# Patient Record
Sex: Female | Born: 1975 | Race: White | Hispanic: No | Marital: Married | Smoking: Never smoker
Health system: Southern US, Community
[De-identification: ages and names within clinical notes are randomized; demographics above are authoritative.]

## PROBLEM LIST (undated history)

## (undated) DIAGNOSIS — R519 Headache, unspecified: Secondary | ICD-10-CM

## (undated) DIAGNOSIS — G8929 Other chronic pain: Secondary | ICD-10-CM

## (undated) DIAGNOSIS — M2550 Pain in unspecified joint: Secondary | ICD-10-CM

## (undated) DIAGNOSIS — G43909 Migraine, unspecified, not intractable, without status migrainosus: Secondary | ICD-10-CM

## (undated) DIAGNOSIS — N809 Endometriosis, unspecified: Secondary | ICD-10-CM

## (undated) DIAGNOSIS — R51 Headache: Secondary | ICD-10-CM

## (undated) HISTORY — DX: Headache: R51

## (undated) HISTORY — DX: Headache, unspecified: R51.9

## (undated) HISTORY — DX: Endometriosis, unspecified: N80.9

## (undated) HISTORY — DX: Pain in unspecified joint: M25.50

## (undated) HISTORY — DX: Other chronic pain: G89.29

## (undated) HISTORY — PX: LAPAROSCOPIC ENDOMETRIOSIS FULGURATION: SUR769

## (undated) HISTORY — DX: Migraine, unspecified, not intractable, without status migrainosus: G43.909

---

## 2000-04-11 ENCOUNTER — Encounter: Payer: Self-pay | Admitting: Internal Medicine

## 2000-04-11 ENCOUNTER — Encounter: Admission: RE | Admit: 2000-04-11 | Discharge: 2000-04-11 | Payer: Self-pay | Admitting: Internal Medicine

## 2003-04-16 ENCOUNTER — Other Ambulatory Visit: Admission: RE | Admit: 2003-04-16 | Discharge: 2003-04-16 | Payer: Self-pay | Admitting: Obstetrics & Gynecology

## 2004-04-17 ENCOUNTER — Other Ambulatory Visit: Admission: RE | Admit: 2004-04-17 | Discharge: 2004-04-17 | Payer: Self-pay | Admitting: Obstetrics & Gynecology

## 2004-12-18 ENCOUNTER — Emergency Department (HOSPITAL_COMMUNITY): Admission: EM | Admit: 2004-12-18 | Discharge: 2004-12-18 | Payer: Self-pay | Admitting: Emergency Medicine

## 2006-01-07 ENCOUNTER — Ambulatory Visit (HOSPITAL_COMMUNITY): Admission: RE | Admit: 2006-01-07 | Discharge: 2006-01-07 | Payer: Self-pay | Admitting: Obstetrics & Gynecology

## 2006-01-21 ENCOUNTER — Ambulatory Visit (HOSPITAL_COMMUNITY): Admission: RE | Admit: 2006-01-21 | Discharge: 2006-01-21 | Payer: Self-pay | Admitting: Obstetrics & Gynecology

## 2006-03-10 ENCOUNTER — Inpatient Hospital Stay (HOSPITAL_COMMUNITY): Admission: AD | Admit: 2006-03-10 | Discharge: 2006-03-10 | Payer: Self-pay | Admitting: Obstetrics and Gynecology

## 2006-04-24 ENCOUNTER — Inpatient Hospital Stay (HOSPITAL_COMMUNITY): Admission: AD | Admit: 2006-04-24 | Discharge: 2006-04-25 | Payer: Self-pay | Admitting: Obstetrics and Gynecology

## 2010-08-18 ENCOUNTER — Inpatient Hospital Stay (HOSPITAL_COMMUNITY): Admission: AD | Admit: 2010-08-18 | Discharge: 2010-08-19 | Payer: Self-pay | Admitting: Obstetrics and Gynecology

## 2010-08-18 ENCOUNTER — Ambulatory Visit: Payer: Self-pay | Admitting: Nurse Practitioner

## 2010-08-19 ENCOUNTER — Inpatient Hospital Stay (HOSPITAL_COMMUNITY): Admission: AD | Admit: 2010-08-19 | Discharge: 2010-08-22 | Payer: Self-pay | Admitting: Obstetrics and Gynecology

## 2010-08-20 ENCOUNTER — Encounter (INDEPENDENT_AMBULATORY_CARE_PROVIDER_SITE_OTHER): Payer: Self-pay | Admitting: Obstetrics and Gynecology

## 2011-01-25 LAB — CBC
HCT: 28.6 % — ABNORMAL LOW (ref 36.0–46.0)
HCT: 39.2 % (ref 36.0–46.0)
Hemoglobin: 10.1 g/dL — ABNORMAL LOW (ref 12.0–15.0)
Hemoglobin: 13.5 g/dL (ref 12.0–15.0)
MCH: 33.3 pg (ref 26.0–34.0)
MCH: 34.2 pg — ABNORMAL HIGH (ref 26.0–34.0)
MCHC: 34.5 g/dL (ref 30.0–36.0)
MCHC: 35.3 g/dL (ref 30.0–36.0)
MCV: 96.3 fL (ref 78.0–100.0)
MCV: 97 fL (ref 78.0–100.0)
Platelets: 227 10*3/uL (ref 150–400)
Platelets: 281 10*3/uL (ref 150–400)
RBC: 2.95 MIL/uL — ABNORMAL LOW (ref 3.87–5.11)
RBC: 4.07 MIL/uL (ref 3.87–5.11)
RDW: 13.1 % (ref 11.5–15.5)
RDW: 13.2 % (ref 11.5–15.5)
WBC: 19.1 10*3/uL — ABNORMAL HIGH (ref 4.0–10.5)
WBC: 26.2 10*3/uL — ABNORMAL HIGH (ref 4.0–10.5)

## 2011-01-25 LAB — RPR: RPR Ser Ql: NONREACTIVE

## 2011-01-25 LAB — ABO/RH: ABO/RH(D): O POS

## 2011-03-30 NOTE — Op Note (Signed)
Shawna Chang, Shawna Chang             ACCOUNT NO.:  [id number]   MEDICAL RECORD NO.:  [id number]          PATIENT TYPE:  AMB   LOCATION:  SDC                           FACILITY:  [facility]   PHYSICIAN:  Genia Del, M.D.DATE OF BIRTH:  [birth year]   DATE OF PROCEDURE:  01/21/2006  DATE OF DISCHARGE:                                 OPERATIVE REPORT   PREOPERATIVE DIAGNOSIS:  Infertility with left proximal tubal obstruction.   POSTOPERATIVE DIAGNOSIS:  Infertility with left proximal tubal obstruction  and mild pelvic endometriosis.   OPERATION/PROCEDURE:  Diagnostic laparoscopy with cauterization of  endometriosis and chromopertubation with methylene blue.   SURGEON:  Genia Del, M.D.   ANESTHESIOLOGIST:  Octaviano Glow. Pamalee Leyden, M.D.   DESCRIPTION OF PROCEDURE:  Under general anesthesia with endotracheal  intubation, the patient placed in the lithotomy position for operative  laparoscopy. She was prepped with Betadine on the abdominal, suprapubic,  vulvar, and vaginal areas and draped as usual.  The bladder catheter was  inserted.  Vaginally the uterus is anteverted, mobile, no adnexal mass.  The  cervix is long and closed.  No bleeding.  We inserted a speculum.  The  anterior lip of the cervix grasped with a tenaculum and the uterus is  cannulated.  We then removed the speculum.  Abdominally we infiltrated the  infraumbilical area with Marcaine 0.25% plain, 6 mL.  We made a 10 mm  incision with the scalpel, opened under direct vision the aponeurosis with  Mayo scissors and bluntly we opened the parietal peritoneum.  We then put a  pursestring stitch of Vicryl 0 at the aponeurosis.  The Hasson is inserted  and the laparoscope is then inserted at that level.  Pneumoperitoneum with  CO2 is created.  We then inspect the abdominopelvic cavity.  The liver and  gallbladder are normal to inspection.  The appendix is normal to inspection.  No pathology is seen in the abdomen.  In the pelvis  we note an anteverted  uterus, normal volume and appearance.  The right tube is normal in  appearance with good fimbria and the right ovary is completely normal.  The  left tube shows some fibrosis at the proximal aspect of it.  The fimbria are  normal in appearance.  Two cysts of Morganii are present distally.  The left  ovary presents a superficial of endometriosis less than 1 cm.  On the  posterior cul-de-sac, towards the left uterosacral ligament, superficial  lesions of endometriosis are present.  On the anterior aspect of the uterus  close to the bladder flap, superficial lesions of the endometriosis are  present and a little bit higher with the lateral right anterior aspect of  the uterus, superficial lesions of endometriosis are present as well.  Pictures are taken of all those lesions and of the pelvic anatomy.  We then  used the bipolar to cauterize all the lesions previously mentioned.  We  raised the peritoneum to remain safe close to the left ureter and close to  the bladder.  We then performed the chromopertubation with methylene blue.  Methylene blue passes very easily  on the right side and a proximal blockage  is present on the left side.  We then removed all instruments, evacuated the  CO2. We attached the Vicryl 0 at the aponeurosis. We then make a  subcuticular stitch of Vicryl 4-0 and put some Dermabond at that level.   To note, we had put a 5 mm trocar under direct vision on the left iliac area  to use the bipolar at that level and at the end of the surgery, the  instrument and trocar were removed under direct vision and Dermabond was  used to close the skin.  The estimated blood loss was minimal.  No  complications occurred and the patient was brought to the recovery room in  good status.      Genia Del, M.D.  Electronically Signed     ML/MEDQ  D:  01/21/2006  T:  01/22/2006  Job:  [id number]

## 2011-05-21 ENCOUNTER — Other Ambulatory Visit (HOSPITAL_COMMUNITY): Payer: Self-pay | Admitting: Rheumatology

## 2011-05-21 ENCOUNTER — Ambulatory Visit (HOSPITAL_COMMUNITY)
Admission: RE | Admit: 2011-05-21 | Discharge: 2011-05-21 | Disposition: A | Discharge status: Home / Self Care | Payer: 59 | Source: Ambulatory Visit | Attending: Rheumatology | Admitting: Rheumatology

## 2011-05-21 DIAGNOSIS — D869 Sarcoidosis, unspecified: Secondary | ICD-10-CM

## 2013-12-22 ENCOUNTER — Other Ambulatory Visit: Payer: Self-pay | Admitting: Family Medicine

## 2013-12-22 ENCOUNTER — Ambulatory Visit
Admission: RE | Admit: 2013-12-22 | Discharge: 2013-12-22 | Disposition: A | Discharge status: Home / Self Care | Payer: BC Managed Care – PPO | Source: Ambulatory Visit | Attending: Family Medicine | Admitting: Family Medicine

## 2013-12-22 DIAGNOSIS — R059 Cough, unspecified: Secondary | ICD-10-CM

## 2013-12-22 DIAGNOSIS — R05 Cough: Secondary | ICD-10-CM

## 2013-12-22 DIAGNOSIS — R0789 Other chest pain: Secondary | ICD-10-CM

## 2015-02-24 ENCOUNTER — Other Ambulatory Visit: Payer: Self-pay | Admitting: Obstetrics and Gynecology

## 2015-02-24 DIAGNOSIS — E041 Nontoxic single thyroid nodule: Secondary | ICD-10-CM

## 2015-02-28 ENCOUNTER — Ambulatory Visit
Admission: RE | Admit: 2015-02-28 | Discharge: 2015-02-28 | Disposition: A | Discharge status: Home / Self Care | Payer: BLUE CROSS/BLUE SHIELD | Source: Ambulatory Visit | Attending: Obstetrics and Gynecology | Admitting: Obstetrics and Gynecology

## 2015-02-28 DIAGNOSIS — E041 Nontoxic single thyroid nodule: Secondary | ICD-10-CM

## 2015-03-08 ENCOUNTER — Other Ambulatory Visit: Payer: Self-pay | Admitting: Obstetrics and Gynecology

## 2015-03-08 DIAGNOSIS — E041 Nontoxic single thyroid nodule: Secondary | ICD-10-CM

## 2015-03-16 ENCOUNTER — Other Ambulatory Visit: Payer: BLUE CROSS/BLUE SHIELD

## 2015-03-17 ENCOUNTER — Ambulatory Visit
Admission: RE | Admit: 2015-03-17 | Discharge: 2015-03-17 | Disposition: A | Discharge status: Home / Self Care | Payer: BLUE CROSS/BLUE SHIELD | Source: Ambulatory Visit | Attending: Obstetrics and Gynecology | Admitting: Obstetrics and Gynecology

## 2015-03-17 ENCOUNTER — Other Ambulatory Visit (HOSPITAL_COMMUNITY)
Admission: RE | Admit: 2015-03-17 | Discharge: 2015-03-17 | Disposition: A | Discharge status: Home / Self Care | Payer: BLUE CROSS/BLUE SHIELD | Source: Ambulatory Visit | Attending: Interventional Radiology | Admitting: Interventional Radiology

## 2015-03-17 DIAGNOSIS — E041 Nontoxic single thyroid nodule: Secondary | ICD-10-CM

## 2015-05-12 ENCOUNTER — Ambulatory Visit (INDEPENDENT_AMBULATORY_CARE_PROVIDER_SITE_OTHER): Payer: BLUE CROSS/BLUE SHIELD | Admitting: Endocrinology

## 2015-05-12 ENCOUNTER — Encounter: Payer: Self-pay | Admitting: Endocrinology

## 2015-05-12 VITALS — BP 114/64 | HR 76 | Temp 97.8°F | Ht 65.5 in | Wt 154.0 lb

## 2015-05-12 DIAGNOSIS — E042 Nontoxic multinodular goiter: Secondary | ICD-10-CM

## 2015-05-12 NOTE — Progress Notes (Signed)
   Subjective:    Patient ID: Shawna Chang, female    DOB: 05/04/1976, 39 y.o.   MRN: [id number]  HPI Pt was noted to have a nodule at the thyroid in in early 2016.  She has never been known to have a thyroid problem before.  she has no h/o XRT or surgery to the neck.  She does not notice the goiter.  she has slight fatigue and assoc weight gain.   No past medical history on file.  No past surgical history on file.  History   Social History  . Marital Status: Married    Spouse Name: N/A  . Number of Children: N/A  . Years of Education: N/A   Occupational History  . Not on file.   Social History Main Topics  . Smoking status: Never Smoker   . Smokeless tobacco: Not on file  . Alcohol Use: No  . Drug Use: Not on file  . Sexual Activity: Not on file   Other Topics Concern  . Not on file   Social History Narrative  . No narrative on file    No current outpatient prescriptions on file prior to visit.   No current facility-administered medications on file prior to visit.    Allergies  Allergen Reactions  . Codeine Other (See Comments)    Family History  Problem Relation Age of Onset  . Thyroid disease Mother   . Thyroid disease Father   . Thyroid disease Sister     BP 114/64 mmHg  Pulse 76  Temp(Src) 97.8 F (36.6 C) (Oral)  Ht 5' 5.5" (1.664 m)  Wt 154 lb (69.854 kg)  BMI 25.23 kg/m2  SpO2 99%  LMP 04/18/2015   Review of Systems Denies dysphagia, sob, and neck pain    Objective:   Physical Exam VITAL SIGNS:  See vs page GENERAL: no distress eyes: no periorbital swelling, no proptosis NECK: thyroid is slightly enlarged, with a multinodular surface. NODES: no palpable lymphadenopathy at the anterior neck. SKIN: not diaphoretic NEURO: no tremor Ext: no edema   outside test results are reviewed: TSH=normal Cytology of thyroid nodule bx: benign nodule.   Radiol: i reviewed thyroid US report      Assessment & Plan:  Thyroid adenoma, new  to me.  She is at risk for hyperthyroidism over time.    Patient is advised the following: Patient Instructions  No thyroid treatment is needed now. Please return in 1 year. most of the time, a "lumpy thyroid" will eventually become overactive.  this is usually a slow process, happening over the span of many years.

## 2015-05-12 NOTE — Patient Instructions (Signed)
No thyroid treatment is needed now. Please return in 1 year. most of the time, a "lumpy thyroid" will eventually become overactive.  this is usually a slow process, happening over the span of many years.

## 2015-05-15 DIAGNOSIS — E042 Nontoxic multinodular goiter: Secondary | ICD-10-CM | POA: Insufficient documentation

## 2015-11-10 ENCOUNTER — Other Ambulatory Visit: Payer: Self-pay

## 2015-11-10 DIAGNOSIS — Z1231 Encounter for screening mammogram for malignant neoplasm of breast: Secondary | ICD-10-CM

## 2015-12-06 ENCOUNTER — Ambulatory Visit
Admission: RE | Admit: 2015-12-06 | Discharge: 2015-12-06 | Disposition: A | Discharge status: Home / Self Care | Payer: BLUE CROSS/BLUE SHIELD | Source: Ambulatory Visit

## 2015-12-06 DIAGNOSIS — Z1231 Encounter for screening mammogram for malignant neoplasm of breast: Secondary | ICD-10-CM

## 2016-05-11 ENCOUNTER — Ambulatory Visit: Payer: BLUE CROSS/BLUE SHIELD | Admitting: Endocrinology

## 2016-05-17 ENCOUNTER — Ambulatory Visit (INDEPENDENT_AMBULATORY_CARE_PROVIDER_SITE_OTHER): Payer: BLUE CROSS/BLUE SHIELD | Admitting: Endocrinology

## 2016-05-17 ENCOUNTER — Encounter: Payer: Self-pay | Admitting: Endocrinology

## 2016-05-17 VITALS — BP 98/62 | HR 63 | Wt 150.2 lb

## 2016-05-17 DIAGNOSIS — E042 Nontoxic multinodular goiter: Secondary | ICD-10-CM

## 2016-05-17 NOTE — Patient Instructions (Addendum)
Let's recheck the ultrasound.  you will receive a phone call, about a day and time for an appointment. blood tests are requested for you today.   We'll let you know about the results of both tests.  Please return in 2 years.  most of the time, a "lumpy thyroid" will eventually become overactive.  this is usually a slow process, happening over the span of many years.

## 2016-05-17 NOTE — Progress Notes (Signed)
   Subjective:    Patient ID: Shawna Chang, female    DOB: 03/21/1976, 40 y.o.   MRN: [id number]  HPI  Pt returns for f/u of multinodular goiter (dx'ed 2016; bx then showed BENIGN FOLLICULAR NODULE (BETHESDA CATEGORY II); she has been euthyroid). She does not notice the goiter.   No past medical history on file.  No past surgical history on file.  Social History   Social History  . Marital Status: Married    Spouse Name: N/A  . Number of Children: N/A  . Years of Education: N/A   Occupational History  . Not on file.   Social History Main Topics  . Smoking status: Never Smoker   . Smokeless tobacco: Not on file  . Alcohol Use: No  . Drug Use: Not on file  . Sexual Activity: Not on file   Other Topics Concern  . Not on file   Social History Narrative    No current outpatient prescriptions on file prior to visit.   No current facility-administered medications on file prior to visit.    Allergies  Allergen Reactions  . Penicillins Other (See Comments)  . Codeine Other (See Comments)    Family History  Problem Relation Age of Onset  . Thyroid disease Mother   . Thyroid disease Father   . Thyroid disease Sister     BP 98/62 mmHg  Pulse 63  Wt 150 lb 3.2 oz (68.13 kg)  SpO2 98%  LMP 05/12/2016  Review of Systems Denies dysphagia and sob.    Objective:   Physical Exam VITAL SIGNS:  See vs page GENERAL: no distress NECK: thyroid is slightly enlarged, with a irregular surface.      Assessment & Plan:  Multinodular goiter: due for recheck.   Patient is advised the following: Patient Instructions  Let's recheck the ultrasound.  you will receive a phone call, about a day and time for an appointment. blood tests are requested for you today.   We'll let you know about the results of both tests.  Please return in 2 years.  most of the time, a "lumpy thyroid" will eventually become overactive.  this is usually a slow process, happening over the span of  many years.   Romero BellingELLISON, Thara Searing, MD

## 2016-05-18 ENCOUNTER — Ambulatory Visit
Admission: RE | Admit: 2016-05-18 | Discharge: 2016-05-18 | Disposition: A | Discharge status: Home / Self Care | Payer: BLUE CROSS/BLUE SHIELD | Source: Ambulatory Visit | Attending: Endocrinology | Admitting: Endocrinology

## 2016-05-18 ENCOUNTER — Telehealth: Payer: Self-pay

## 2016-05-18 DIAGNOSIS — E042 Nontoxic multinodular goiter: Secondary | ICD-10-CM | POA: Diagnosis not present

## 2016-05-18 LAB — TSH: TSH: 1.22 u[IU]/mL (ref 0.450–4.500)

## 2016-05-18 NOTE — Telephone Encounter (Signed)
Called and spoke with patient about normal lab results. No questions or concerns.

## 2016-06-22 ENCOUNTER — Ambulatory Visit (INDEPENDENT_AMBULATORY_CARE_PROVIDER_SITE_OTHER): Payer: BLUE CROSS/BLUE SHIELD | Admitting: Podiatry

## 2016-06-22 ENCOUNTER — Ambulatory Visit (INDEPENDENT_AMBULATORY_CARE_PROVIDER_SITE_OTHER): Payer: BLUE CROSS/BLUE SHIELD

## 2016-06-22 DIAGNOSIS — M79672 Pain in left foot: Secondary | ICD-10-CM

## 2016-06-22 DIAGNOSIS — M79671 Pain in right foot: Secondary | ICD-10-CM | POA: Diagnosis not present

## 2016-06-22 DIAGNOSIS — M722 Plantar fascial fibromatosis: Secondary | ICD-10-CM | POA: Diagnosis not present

## 2016-06-22 MED ORDER — DICLOFENAC SODIUM 75 MG PO TBEC: 75.0000 mg | DELAYED_RELEASE_TABLET | Freq: Two times a day (BID) | ORAL | 2 refills | Status: DC

## 2016-06-22 MED ORDER — TRIAMCINOLONE ACETONIDE 10 MG/ML IJ SUSP
10.0000 mg | Freq: Once | INTRAMUSCULAR | Status: AC
  Administered 2016-06-22: 10 mg

## 2016-06-22 NOTE — Patient Instructions (Signed)

## 2016-06-22 NOTE — Progress Notes (Signed)
   Subjective:    Patient ID: Shawna Chang, female    DOB: 07/05/1976, 40 y.o.   MRN: [id number]  HPI  I have been having pain in my arch and forefoot fore several months.     Review of Systems  All other systems reviewed and are negative.      Objective:   Physical Exam        Assessment & Plan:

## 2016-06-26 NOTE — Progress Notes (Signed)
Subjective:     Patient ID: Shawna Chang, female   DOB: 06/27/1976, 40 y.o.   MRN: [id number]  HPI patient states that she's having a lot of pain in her arch right over left with inflammation fluid buildup in both of them are bothering her at times   Review of Systems  All other systems reviewed and are negative.      Objective:   Physical Exam  Constitutional: She is oriented to person, place, and time.  Cardiovascular: Intact distal pulses.   Musculoskeletal: Normal range of motion.  Neurological: She is oriented to person, place, and time.  Skin: Skin is warm.  Nursing note and vitals reviewed.  neurovascular status found to be intact with muscle strength adequate inflammation noted mid arch area bilateral with patient found to have good digital perfusion and well oriented 3. 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 Today mid arch injection was accomplished 3 mg Kenalog 5 mg Xylocaine and fascial strapping and braces applied with instructions on usage along with physical therapy and shoe gear modifications. Reappoint to recheck and also placed on diclofenac 75 mg twice a day  X-rays indicate moderate depression of the arch with no indications of stress fracture arthritis

## 2016-07-06 ENCOUNTER — Ambulatory Visit: Payer: BLUE CROSS/BLUE SHIELD | Admitting: Podiatry

## 2016-07-13 ENCOUNTER — Ambulatory Visit: Payer: BLUE CROSS/BLUE SHIELD | Admitting: Podiatry

## 2016-08-23 DIAGNOSIS — N939 Abnormal uterine and vaginal bleeding, unspecified: Secondary | ICD-10-CM | POA: Diagnosis not present

## 2016-09-19 DIAGNOSIS — N938 Other specified abnormal uterine and vaginal bleeding: Secondary | ICD-10-CM | POA: Diagnosis not present

## 2016-10-16 DIAGNOSIS — J0110 Acute frontal sinusitis, unspecified: Secondary | ICD-10-CM | POA: Diagnosis not present

## 2016-10-31 ENCOUNTER — Other Ambulatory Visit: Payer: Self-pay | Admitting: Obstetrics and Gynecology

## 2016-10-31 DIAGNOSIS — Z1231 Encounter for screening mammogram for malignant neoplasm of breast: Secondary | ICD-10-CM

## 2016-12-06 ENCOUNTER — Ambulatory Visit
Admission: RE | Admit: 2016-12-06 | Discharge: 2016-12-06 | Disposition: A | Discharge status: Home / Self Care | Payer: BLUE CROSS/BLUE SHIELD | Source: Ambulatory Visit | Attending: Obstetrics and Gynecology | Admitting: Obstetrics and Gynecology

## 2016-12-06 DIAGNOSIS — Z1231 Encounter for screening mammogram for malignant neoplasm of breast: Secondary | ICD-10-CM | POA: Diagnosis not present

## 2016-12-10 ENCOUNTER — Other Ambulatory Visit: Payer: Self-pay | Admitting: Obstetrics and Gynecology

## 2016-12-10 DIAGNOSIS — R928 Other abnormal and inconclusive findings on diagnostic imaging of breast: Secondary | ICD-10-CM

## 2016-12-17 ENCOUNTER — Ambulatory Visit
Admission: RE | Admit: 2016-12-17 | Discharge: 2016-12-17 | Disposition: A | Discharge status: Home / Self Care | Payer: BLUE CROSS/BLUE SHIELD | Source: Ambulatory Visit | Attending: Obstetrics and Gynecology | Admitting: Obstetrics and Gynecology

## 2016-12-17 DIAGNOSIS — R922 Inconclusive mammogram: Secondary | ICD-10-CM | POA: Diagnosis not present

## 2016-12-17 DIAGNOSIS — R928 Other abnormal and inconclusive findings on diagnostic imaging of breast: Secondary | ICD-10-CM

## 2016-12-17 DIAGNOSIS — N6489 Other specified disorders of breast: Secondary | ICD-10-CM | POA: Diagnosis not present

## 2017-01-15 DIAGNOSIS — L718 Other rosacea: Secondary | ICD-10-CM | POA: Diagnosis not present

## 2017-01-15 DIAGNOSIS — D2239 Melanocytic nevi of other parts of face: Secondary | ICD-10-CM | POA: Diagnosis not present

## 2017-01-15 DIAGNOSIS — D2262 Melanocytic nevi of left upper limb, including shoulder: Secondary | ICD-10-CM | POA: Diagnosis not present

## 2017-01-15 DIAGNOSIS — D1801 Hemangioma of skin and subcutaneous tissue: Secondary | ICD-10-CM | POA: Diagnosis not present

## 2017-03-12 DIAGNOSIS — Z6824 Body mass index (BMI) 24.0-24.9, adult: Secondary | ICD-10-CM | POA: Diagnosis not present

## 2017-03-12 DIAGNOSIS — Z01419 Encounter for gynecological examination (general) (routine) without abnormal findings: Secondary | ICD-10-CM | POA: Diagnosis not present

## 2017-03-27 DIAGNOSIS — T7840XA Allergy, unspecified, initial encounter: Secondary | ICD-10-CM | POA: Diagnosis not present

## 2017-04-22 DIAGNOSIS — R220 Localized swelling, mass and lump, head: Secondary | ICD-10-CM | POA: Diagnosis not present

## 2017-04-22 DIAGNOSIS — T7840XA Allergy, unspecified, initial encounter: Secondary | ICD-10-CM | POA: Diagnosis not present

## 2017-05-27 DIAGNOSIS — H1789 Other corneal scars and opacities: Secondary | ICD-10-CM | POA: Diagnosis not present

## 2017-07-01 DIAGNOSIS — J019 Acute sinusitis, unspecified: Secondary | ICD-10-CM | POA: Diagnosis not present

## 2017-07-08 DIAGNOSIS — R002 Palpitations: Secondary | ICD-10-CM | POA: Diagnosis not present

## 2017-07-08 DIAGNOSIS — J329 Chronic sinusitis, unspecified: Secondary | ICD-10-CM | POA: Diagnosis not present

## 2017-07-08 DIAGNOSIS — Z0000 Encounter for general adult medical examination without abnormal findings: Secondary | ICD-10-CM | POA: Diagnosis not present

## 2017-07-08 DIAGNOSIS — Z13220 Encounter for screening for lipoid disorders: Secondary | ICD-10-CM | POA: Diagnosis not present

## 2017-07-08 DIAGNOSIS — M797 Fibromyalgia: Secondary | ICD-10-CM | POA: Diagnosis not present

## 2017-07-08 DIAGNOSIS — Z131 Encounter for screening for diabetes mellitus: Secondary | ICD-10-CM | POA: Diagnosis not present

## 2017-07-08 DIAGNOSIS — R51 Headache: Secondary | ICD-10-CM | POA: Diagnosis not present

## 2017-07-09 ENCOUNTER — Other Ambulatory Visit: Payer: Self-pay | Admitting: Family Medicine

## 2017-07-09 DIAGNOSIS — J329 Chronic sinusitis, unspecified: Secondary | ICD-10-CM

## 2017-07-11 ENCOUNTER — Encounter: Payer: Self-pay | Admitting: Neurology

## 2017-07-11 ENCOUNTER — Ambulatory Visit (INDEPENDENT_AMBULATORY_CARE_PROVIDER_SITE_OTHER): Payer: BLUE CROSS/BLUE SHIELD | Admitting: Neurology

## 2017-07-11 DIAGNOSIS — G43709 Chronic migraine without aura, not intractable, without status migrainosus: Secondary | ICD-10-CM | POA: Diagnosis not present

## 2017-07-11 DIAGNOSIS — IMO0002 Reserved for concepts with insufficient information to code with codable children: Secondary | ICD-10-CM

## 2017-07-11 DIAGNOSIS — G43909 Migraine, unspecified, not intractable, without status migrainosus: Secondary | ICD-10-CM | POA: Insufficient documentation

## 2017-07-11 MED ORDER — SUMATRIPTAN SUCCINATE 50 MG PO TABS: 50.0000 mg | ORAL_TABLET | ORAL | 6 refills | Status: DC | PRN

## 2017-07-11 NOTE — Progress Notes (Signed)
PATIENT: Shawna Chang DOB: 11/30/1975  Chief Complaint  Patient presents with  . Headache    Reports having one migraine per month during her menstrual cycle.  NSAIDS are not beneficial.  She tends to have nausea and vomiting with these headaches.  She has a pending CT head due to chronic sinusitis.   Marland Kitchen. PCP    Maurice SmallGriffin, Elaine, MD  . OB-GYN    Olivia Mackieaavon, Richard, MD - referring MD     HISTORICAL  Shawna Chang is a 41 years old right-handed female, seen in refer by her primary care doctor Maurice SmallGriffin, Elaine and Dr. Olivia Mackieaavon, Richard, for evaluation of chronic migraine headaches, initial evaluation was July 11 2017.  I reviewed and summarized the referring note, she has left ear fluid, otherwise healthy, she began to notice almost monthly headaches since February 2018, it started before her menstruation cycle, sometimes preceded by seeing sparkling in her visual field, with lateralized moderate pounding headache, light noise sensitivity, nauseous, lasting for couple hours, improved by lying in dark quiet room,  She has tried over-the-counter Tylenol, Excedrin Migraine with limited help, Aleve plus caffeine was helpful,  REVIEW OF SYSTEMS: Full 14 system review of systems performed and notable only for Weight gain, fatigue, chest pain, palpitation, murmur, increased thirst, joint pain, swelling, allergy, runny nose, headaches  ALLERGIES: Allergies  Allergen Reactions  . Amoxicillin     GI issues  . Codeine Other (See Comments)    Unsure - childhood allergy  . Lidocaine Hives    HOME MEDICATIONS: Current Outpatient Prescriptions  Medication Sig Dispense Refill  . sulfamethoxazole-trimethoprim (BACTRIM DS,SEPTRA DS) 800-160 MG tablet      No current facility-administered medications for this visit.     PAST MEDICAL HISTORY: Past Medical History:  Diagnosis Date  . Chronic joint pain   . Endometriosis   . Migraine     PAST SURGICAL HISTORY: Past Surgical History:    Procedure Laterality Date  . LAPAROSCOPIC ENDOMETRIOSIS FULGURATION      FAMILY HISTORY: Family History  Problem Relation Age of Onset  . Thyroid disease Mother   . Goiter Mother   . Thyroid disease Father   . Colon cancer Father   . Thyroid disease Sister   . Heart disease Paternal Grandmother   . Parkinson's disease Paternal Grandfather     SOCIAL HISTORY:  Social History   Social History  . Marital status: Married    Spouse name: N/A  . Number of children: 1  . Years of education: College   Occupational History  . Customer service    Social History Main Topics  . Smoking status: Never Smoker  . Smokeless tobacco: Current User    Types: Snuff  . Alcohol use No  . Drug use: No  . Sexual activity: Not on file   Other Topics Concern  . Not on file   Social History Narrative   Lives at home with husband and daughter.   Right-handed.   No caffeine use.     PHYSICAL EXAM   Vitals:   07/11/17 0832  BP: (!) 91/57  Pulse: 67  Weight: 154 lb 12 oz (70.2 kg)  Height: 5' 5.5" (1.664 m)    Not recorded      Body mass index is 25.36 kg/m.  PHYSICAL EXAMNIATION:  Gen: NAD, conversant, well nourised, obese, well groomed                     Cardiovascular: Regular  rate rhythm, no peripheral edema, warm, nontender. Eyes: Conjunctivae clear without exudates or hemorrhage Neck: Supple, no carotid bruits. Pulmonary: Clear to auscultation bilaterally   NEUROLOGICAL EXAM:  MENTAL STATUS: Speech:    Speech is normal; fluent and spontaneous with normal comprehension.  Cognition:     Orientation to time, place and person     Normal recent and remote memory     Normal Attention span and concentration     Normal Language, naming, repeating,spontaneous speech     Fund of knowledge   CRANIAL NERVES: CN II: Visual fields are full to confrontation. Fundoscopic exam is normal with sharp discs and no vascular changes. Pupils are round equal and briskly reactive  to light. CN III, IV, VI: extraocular movement are normal. No ptosis. CN V: Facial sensation is intact to pinprick in all 3 divisions bilaterally. Corneal responses are intact.  CN VII: Face is symmetric with normal eye closure and smile. CN VIII: Hearing is normal to rubbing fingers CN IX, X: Palate elevates symmetrically. Phonation is normal. CN XI: Head turning and shoulder shrug are intact CN XII: Tongue is midline with normal movements and no atrophy.  MOTOR: There is no pronator drift of out-stretched arms. Muscle bulk and tone are normal. Muscle strength is normal.  REFLEXES: Reflexes are 2+ and symmetric at the biceps, triceps, knees, and ankles. Plantar responses are flexor.  SENSORY: Intact to light touch, pinprick, positional sensation and vibratory sensation are intact in fingers and toes.  COORDINATION: Rapid alternating movements and fine finger movements are intact. There is no dysmetria on finger-to-nose and heel-knee-shin.    GAIT/STANCE: Posture is normal. Gait is steady with normal steps, base, arm swing, and turning. Heel and toe walking are normal. Tandem gait is normal.  Romberg is absent.   DIAGNOSTIC DATA (LABS, IMAGING, TESTING) - I reviewed patient records, labs, notes, testing and imaging myself where available.   ASSESSMENT AND PLAN  [name] is a 41 y.o. female   Chronic migraine  Imitrex 50 mg as needed  She may combine it with Aleve, caffeine  We also went over the potential trigger for her migraines,   Levert Feinstein, M.D. Ph.D.  [facility] [address], [address] [place], [place] [address] Ph: [phone] Fax: [phone]  CC: Maurice Small, MD, Olivia Mackie, MD

## 2017-07-12 ENCOUNTER — Ambulatory Visit
Admission: RE | Admit: 2017-07-12 | Discharge: 2017-07-12 | Disposition: A | Discharge status: Home / Self Care | Payer: BLUE CROSS/BLUE SHIELD | Source: Ambulatory Visit | Attending: Family Medicine | Admitting: Family Medicine

## 2017-07-12 DIAGNOSIS — J329 Chronic sinusitis, unspecified: Secondary | ICD-10-CM

## 2017-07-23 ENCOUNTER — Encounter: Payer: Self-pay | Admitting: Allergy and Immunology

## 2017-07-23 ENCOUNTER — Ambulatory Visit (INDEPENDENT_AMBULATORY_CARE_PROVIDER_SITE_OTHER): Payer: BLUE CROSS/BLUE SHIELD | Admitting: Allergy and Immunology

## 2017-07-23 VITALS — BP 108/76 | HR 68 | Resp 18 | Ht 65.5 in | Wt 156.4 lb

## 2017-07-23 DIAGNOSIS — G43909 Migraine, unspecified, not intractable, without status migrainosus: Secondary | ICD-10-CM | POA: Diagnosis not present

## 2017-07-23 DIAGNOSIS — B001 Herpesviral vesicular dermatitis: Secondary | ICD-10-CM | POA: Diagnosis not present

## 2017-07-23 DIAGNOSIS — J3089 Other allergic rhinitis: Secondary | ICD-10-CM | POA: Diagnosis not present

## 2017-07-23 DIAGNOSIS — M2550 Pain in unspecified joint: Secondary | ICD-10-CM | POA: Diagnosis not present

## 2017-07-23 MED ORDER — VALACYCLOVIR HCL 500 MG PO TABS: 500.0000 mg | ORAL_TABLET | Freq: Every day | ORAL | 6 refills | Status: DC

## 2017-07-23 NOTE — Patient Instructions (Addendum)
  1. Allergen avoidance measures?  2. Valtrex 500 mg 1 time per day  3. CMP, CBC w/diff, ANA w/reflex, HSV 1/2 IgM/IgG  4. Avoid all forms of caffeine including chocolate consumption  5. Contact clinic concerning response to treatment over the next 4 weeks  6. Can add OTC antihistamine - Claritin/Allegra/Zyrtec - one time per day  7. Further treatment?

## 2017-07-23 NOTE — Progress Notes (Signed)
Dear Dr. Valentina Lucks,  Thank you for referring Shawna Chang to the [facility] on 07/23/2017.   Below is a summation of this patient's evaluation and recommendations.  Thank you for your referral. I will keep you informed about this patient's response to treatment.   If you have any questions please do not hesitate to contact me.   Sincerely,  Jessica Priest, MD Allergy / Immunology  [facility]   ______________________________________________________________________    NEW PATIENT NOTE  Referring Provider: Maurice Small, MD Primary Provider: Maurice Small, MD Date of office visit: 07/23/2017    Subjective:   Chief Complaint:  Shawna Chang (DOB: December 14, 1975) is a 41 y.o. female who presents to the clinic on 07/23/2017 with a chief complaint of Allergic Rhinitis  .     HPI: Shawna Chang presents to this clinic in evaluation of problems that have developed over the course of the past 2 months. Apparently she has been developing recurrent episodes of red swollen painful lips that end up chapping and have required 4 courses of systemic steroids with resolution in approximately one week. There is no associated systemic or constitutional symptoms with these episodes. There is no obvious provoking factor giving rise to this issue.  However, during this timeframe she is also been having a left frontal and cheek "pressure" headache and has been treated with 2 courses of antibiotics. A CT scan of her sinuses in investigation of this issue did not identify any sinusitis. She is followed by a neurologist for migraine headaches.She has also noticed that the skin under her left eye appears to be somewhat "bumpy". She does have a history of runny nose usually clear in production without any associated sneezing nor anosmia. She does have a history of arthralgia that has been evaluated by a rheumatologist  in the past and apparently there is no autoimmune disease identified.  Past Medical History:  Diagnosis Date  . Chronic joint pain   . Endometriosis   . Migraine     Past Surgical History:  Procedure Laterality Date  . LAPAROSCOPIC ENDOMETRIOSIS FULGURATION      Allergies as of 07/23/2017      Reactions   Amoxicillin    GI issues   Codeine Other (See Comments)   Unsure - childhood allergy   Lidocaine Hives      Medication List      SUMAtriptan 50 MG tablet Commonly known as:  IMITREX Take 1 tablet (50 mg total) by mouth every 2 (two) hours as needed for migraine. May repeat in 2 hours if headache persists or recurs.       Review of systems negative except as noted in HPI / PMHx or noted below:  Review of Systems  Constitutional: Negative.   HENT: Negative.   Eyes: Negative.   Respiratory: Negative.   Cardiovascular: Negative.   Gastrointestinal: Negative.   Genitourinary: Negative.   Musculoskeletal: Negative.   Skin: Negative.   Neurological: Negative.   Endo/Heme/Allergies: Negative.   Psychiatric/Behavioral: Negative.     Family History  Problem Relation Age of Onset  . Thyroid disease Mother   . Goiter Mother   . Thyroid disease Father   . Colon cancer Father   . Thyroid disease Sister   . Heart disease Paternal Grandmother   . Parkinson's disease Paternal Grandfather     Social History   Social History  . Marital status: Married  Spouse name: N/A  . Number of children: 1  . Years of education: College   Occupational History  . Customer service    Social History Main Topics  . Smoking status: Never Smoker  . Smokeless tobacco: Never Used  . Alcohol use No  . Drug use: No  . Sexual activity: Not on file   Other Topics Concern  . Not on file   Social History Narrative   Lives at home with husband and daughter.   Right-handed.   No caffeine use.    Environmental and Social history  Lives in a house with a dry environment, a  dog located inside the household, plastic on the bed, no plastic on the pillow, no smokers located inside the household, and employment in an office setting.  Objective:   Vitals:   07/23/17 0831  BP: 108/76  Pulse: 68  Resp: 18   Height: 5' 5.5" (166.4 cm) Weight: 156 lb 6.4 oz (70.9 kg)  Physical Exam  Constitutional: She is well-developed, well-nourished, and in no distress.  HENT:  Head: Normocephalic. Head is without right periorbital erythema and without left periorbital erythema.  Right Ear: Tympanic membrane, external ear and ear canal normal.  Left Ear: Tympanic membrane, external ear and ear canal normal.  Nose: Nose normal. No mucosal edema or rhinorrhea.  Mouth/Throat: Oropharynx is clear and moist and mucous membranes are normal. No oropharyngeal exudate.  Eyes: Pupils are equal, round, and reactive to light. Conjunctivae and lids are normal.  Neck: Trachea normal. No tracheal deviation present. No thyromegaly present.  Cardiovascular: Normal rate, regular rhythm, S1 normal, S2 normal and normal heart sounds.   No murmur heard. Pulmonary/Chest: Effort normal. No stridor. No tachypnea. No respiratory distress. She has no wheezes. She has no rales. She exhibits no tenderness.  Abdominal: Soft. She exhibits no distension and no mass. There is no hepatosplenomegaly. There is no tenderness. There is no rebound and no guarding.  Musculoskeletal: She exhibits no edema or tenderness.  Lymphadenopathy:       Head (right side): No tonsillar adenopathy present.       Head (left side): No tonsillar adenopathy present.    She has no cervical adenopathy.    She has no axillary adenopathy.  Neurological: She is alert. Gait normal.  Skin: No rash noted. She is not diaphoretic. No erythema. No pallor. Nails show no clubbing.  Psychiatric: Mood and affect normal.    Diagnostics: Allergy skin tests were performed. She did not demonstrate any hypersensitivity against a screening panel  of aeroallergens or foods.  Results of a sinus CT scan obtained 30 June August 2018 identified the following:  Normally aerated paranasal sinuses.  Patent sinus drainage pathways.   Assessment and Plan:    1. Migraine syndrome   2. Herpes labialis   3. Other allergic rhinitis   4. Arthralgia, unspecified joint     1. Allergen avoidance measures?  2. Valtrex 500 mg 1 time per day  3. CMP, CBC w/diff, ANA w/reflex, HSV 1/2 IgM/IgG  4. Avoid all forms of caffeine including chocolate consumption  5. Contact clinic concerning response to treatment over the next 4 weeks  6. Can add OTC antihistamine - Claritin/Allegra/Zyrtec - one time per day  7. Further treatment?  I think it is quite possible that Tawonda has granulomatous chelitis or possible herpetic infection giving rise to her lip issue. She will use Valtrex empirically over the course of the next month and keep in contact with me  noting her response to this approach. We will look for a autoimmune disease such as Sjogren syndrome tied up with granulomatous chelitis. For her migraine headaches I did make the general recommendation that she can eliminate all forms of caffeine including chocolate consumption.  Jessica PriestEric J. Kozlow, MD Allergy / Immunology [facility]

## 2017-07-24 ENCOUNTER — Encounter: Payer: Self-pay | Admitting: Allergy and Immunology

## 2017-07-25 LAB — COMPREHENSIVE METABOLIC PANEL
ALT: 11 IU/L (ref 0–32)
AST: 17 IU/L (ref 0–40)
Albumin/Globulin Ratio: 1.5 (ref 1.2–2.2)
Albumin: 4.3 g/dL (ref 3.5–5.5)
Alkaline Phosphatase: 55 IU/L (ref 39–117)
BUN/Creatinine Ratio: 11 (ref 9–23)
BUN: 7 mg/dL (ref 6–24)
Bilirubin Total: 0.5 mg/dL (ref 0.0–1.2)
CO2: 24 mmol/L (ref 20–29)
Calcium: 9.5 mg/dL (ref 8.7–10.2)
Chloride: 101 mmol/L (ref 96–106)
Creatinine, Ser: 0.65 mg/dL (ref 0.57–1.00)
GFR calc Af Amer: 127 mL/min/{1.73_m2} (ref >59)
GFR calc non Af Amer: 110 mL/min/{1.73_m2} (ref >59)
Globulin, Total: 2.8 g/dL (ref 1.5–4.5)
Glucose: 85 mg/dL (ref 65–99)
Potassium: 4.2 mmol/L (ref 3.5–5.2)
Sodium: 138 mmol/L (ref 134–144)
Total Protein: 7.1 g/dL (ref 6.0–8.5)

## 2017-07-25 LAB — CBC WITH DIFFERENTIAL/PLATELET
Basophils Absolute: 0 10*3/uL (ref 0.0–0.2)
Basos: 1 %
EOS (ABSOLUTE): 0.1 10*3/uL (ref 0.0–0.4)
Eos: 1 %
Hematocrit: 40.2 % (ref 34.0–46.6)
Hemoglobin: 13.6 g/dL (ref 11.1–15.9)
Immature Grans (Abs): 0 10*3/uL (ref 0.0–0.1)
Immature Granulocytes: 0 %
Lymphocytes Absolute: 1.6 10*3/uL (ref 0.7–3.1)
Lymphs: 32 %
MCH: 31.1 pg (ref 26.6–33.0)
MCHC: 33.8 g/dL (ref 31.5–35.7)
MCV: 92 fL (ref 79–97)
Monocytes Absolute: 0.4 10*3/uL (ref 0.1–0.9)
Monocytes: 9 %
Neutrophils Absolute: 2.9 10*3/uL (ref 1.4–7.0)
Neutrophils: 57 %
Platelets: 251 10*3/uL (ref 150–379)
RBC: 4.37 x10E6/uL (ref 3.77–5.28)
RDW: 12.8 % (ref 12.3–15.4)
WBC: 5 10*3/uL (ref 3.4–10.8)

## 2017-07-25 LAB — ANA W/REFLEX: Anti Nuclear Antibody(ANA): NEGATIVE

## 2017-07-25 LAB — HSV(HERPES SMPLX)ABS-I+II(IGG+IGM)-BLD
HSV 1 Glycoprotein G Ab, IgG: <0.91 index (ref 0.00–0.90)
HSV 2 IgG, Type Spec: <0.91 index (ref 0.00–0.90)
HSVI/II Comb IgM: 1.52 Ratio — ABNORMAL HIGH (ref 0.00–0.90)

## 2017-08-09 ENCOUNTER — Telehealth: Payer: Self-pay | Admitting: Allergy and Immunology

## 2017-08-09 NOTE — Telephone Encounter (Signed)
Patient called back and I informed her of Dr. Kathyrn Lass recommendation and she will call us back in 2 weeks with an update.

## 2017-08-09 NOTE — Telephone Encounter (Signed)
Please inform patient that her blood tests do suggest that this may be a herpetic infection and maybe we just do not have the right dose of medication. Have her double up her Valtrex to twice a day and report back in 2 weeks with an update.

## 2017-08-09 NOTE — Telephone Encounter (Signed)
Left message to return call 

## 2017-08-09 NOTE — Telephone Encounter (Signed)
Pt called and said that the Valtrex was not working. [facility] [place]. [phone].

## 2017-09-16 ENCOUNTER — Telehealth: Payer: Self-pay | Admitting: Allergy and Immunology

## 2017-09-16 NOTE — Telephone Encounter (Signed)
Shawna Chang called in and stated that she was taking VALTREX and it wasn't helping her so Dr. Lucie LeatherKozlow had her double the dosage.  Shawna Chang stated, even with doubling the dosage, she is still having issues.  Shawna Chang would like to know what to do and would like a phone call on her cell please.  Please advise.

## 2017-09-20 ENCOUNTER — Other Ambulatory Visit: Payer: Self-pay | Admitting: *Deleted

## 2017-09-20 MED ORDER — VALACYCLOVIR HCL 1 G PO TABS: ORAL_TABLET | ORAL | 0 refills | Status: DC

## 2017-09-20 NOTE — Telephone Encounter (Signed)
Patient informed and [facility]

## 2017-09-20 NOTE — Telephone Encounter (Signed)
Discussed with patient. Please call out Valtrex 1,000 mg twice a day for 10 days. If no response next week  Will refer to dermatologist / oral surgeon for lip biopsy. Please inform patient about prescription.

## 2017-09-25 ENCOUNTER — Telehealth: Payer: Self-pay | Admitting: Allergy and Immunology

## 2017-09-25 MED ORDER — MOMETASONE FUROATE 0.1 % EX OINT: TOPICAL_OINTMENT | CUTANEOUS | 1 refills | Status: DC

## 2017-09-25 MED ORDER — PREDNISONE 10 MG PO TABS: 10.0000 mg | ORAL_TABLET | Freq: Every day | ORAL | 0 refills | Status: AC

## 2017-09-25 NOTE — Telephone Encounter (Signed)
Shawna Chang called in and stated she wanted to let Dr. Lucie LeatherKozlow know she has had no improvement even after the medication was doubled.  Please advise.

## 2017-09-25 NOTE — Telephone Encounter (Signed)
Patient called back. She has been advised of medications and need for biopsy. I will put in order for her to see dermatology [facility]and[facility]

## 2017-09-25 NOTE — Telephone Encounter (Signed)
Please inform patient that she can use mometasone 0.1% ointment on lips twice a day until resolution.  As well, she can add prednisone 10 mg tablet 1 time per day for 4 days.  She will need a biopsy of her lips and we will need to arrange for her to see a dermatologist for this procedure.

## 2017-09-25 NOTE — Telephone Encounter (Signed)
Left message for patient to call back to discuss recommendations.  

## 2017-09-27 NOTE — Addendum Note (Signed)
Addended by: Mliss FritzBLACK, Alexx Giambra I on: 09/27/2017 02:12 PM   Modules accepted: Orders

## 2017-09-30 NOTE — Addendum Note (Signed)
Addended by: Mliss FritzBLACK, Amman Bartel I on: 09/30/2017 07:18 AM   Modules accepted: Orders

## 2017-10-14 ENCOUNTER — Ambulatory Visit: Payer: BLUE CROSS/BLUE SHIELD | Admitting: Neurology

## 2017-10-29 ENCOUNTER — Encounter: Payer: Self-pay | Admitting: Endocrinology

## 2017-10-29 DIAGNOSIS — L659 Nonscarring hair loss, unspecified: Secondary | ICD-10-CM | POA: Diagnosis not present

## 2017-10-29 DIAGNOSIS — N6321 Unspecified lump in the left breast, upper outer quadrant: Secondary | ICD-10-CM | POA: Diagnosis not present

## 2017-10-30 ENCOUNTER — Other Ambulatory Visit: Payer: Self-pay | Admitting: Obstetrics and Gynecology

## 2017-10-30 DIAGNOSIS — N6320 Unspecified lump in the left breast, unspecified quadrant: Secondary | ICD-10-CM

## 2017-11-06 ENCOUNTER — Other Ambulatory Visit: Payer: BLUE CROSS/BLUE SHIELD

## 2017-11-07 ENCOUNTER — Ambulatory Visit
Admission: RE | Admit: 2017-11-07 | Discharge: 2017-11-07 | Disposition: A | Discharge status: Home / Self Care | Payer: BLUE CROSS/BLUE SHIELD | Source: Ambulatory Visit | Attending: Obstetrics and Gynecology | Admitting: Obstetrics and Gynecology

## 2017-11-07 DIAGNOSIS — N6320 Unspecified lump in the left breast, unspecified quadrant: Secondary | ICD-10-CM

## 2017-11-07 DIAGNOSIS — N6489 Other specified disorders of breast: Secondary | ICD-10-CM | POA: Diagnosis not present

## 2017-11-07 DIAGNOSIS — R922 Inconclusive mammogram: Secondary | ICD-10-CM | POA: Diagnosis not present

## 2017-11-15 ENCOUNTER — Other Ambulatory Visit: Payer: Self-pay | Admitting: Obstetrics and Gynecology

## 2017-11-15 DIAGNOSIS — Z1231 Encounter for screening mammogram for malignant neoplasm of breast: Secondary | ICD-10-CM

## 2017-11-19 ENCOUNTER — Ambulatory Visit (INDEPENDENT_AMBULATORY_CARE_PROVIDER_SITE_OTHER): Payer: BLUE CROSS/BLUE SHIELD | Admitting: Family Medicine

## 2017-11-19 ENCOUNTER — Encounter: Payer: Self-pay | Admitting: Family Medicine

## 2017-11-19 VITALS — BP 94/62 | HR 80 | Resp 16

## 2017-11-19 DIAGNOSIS — B001 Herpesviral vesicular dermatitis: Secondary | ICD-10-CM | POA: Diagnosis not present

## 2017-11-19 DIAGNOSIS — R220 Localized swelling, mass and lump, head: Secondary | ICD-10-CM

## 2017-11-19 DIAGNOSIS — J3089 Other allergic rhinitis: Secondary | ICD-10-CM

## 2017-11-19 DIAGNOSIS — G43909 Migraine, unspecified, not intractable, without status migrainosus: Secondary | ICD-10-CM | POA: Diagnosis not present

## 2017-11-19 DIAGNOSIS — M2550 Pain in unspecified joint: Secondary | ICD-10-CM | POA: Insufficient documentation

## 2017-11-19 NOTE — Patient Instructions (Signed)
1. Facial swelling We will draw blood for further lab work. These will result in about 1 week. I will call you with the results as soon as I get them   2. Herpes labialis - You may continue with Valtrex as needed  3. Other allergic rhinitis - Antihistamine as needed - Consider nasal rinse  4. Migraine syndrome - Continue with Imitrex as needed for migraine   5. Arthralgia, unspecified joint - Referral to rheumatology specialists

## 2017-11-19 NOTE — Addendum Note (Signed)
Addended by: Hetty BlendAMBS, Jaymarion Trombly M on: 11/19/2017 08:31 PM   Modules accepted: Orders

## 2017-11-19 NOTE — Progress Notes (Signed)
[address] [place] [address] Dept: [phone]  FAMILY NURSE PRACTITIONER FOLLOW UP NOTE  Patient ID: Lewie Chamber, female    DOB: 10-Oct-1976  Age: 42 y.o. MRN: [id number] Date of Office Visit: 11/19/2017  Assessment  Chief Complaint: Facial Swelling (left side and lip is itching)  HPI Shawna Chang is a 42 year old female who presents to the clinic for a follow up visit. She was last seen in this clinic on 07/23/2017 by Dr. Lucie Leather for evaluation of allergic rhinitis, facial and lip swelling, migraine headache, and chronic joint pain.  At that visit she was negative for skin prick testing to all aeroallergens and the full food panel was negative.  ANA, CMP, and CBC with differential were all noted to be negative.  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  Sinus CT obtained May 11, 2017 demonstrated patent sinus drainage airways.  Has tried Valtrex at the highest dose with no relief.  At today's visit Charne is reporting she still has facial swelling that occurs on the left side especially around her left eye and the left side of her neck.  Her bottom lip will occasionally swell.  She reports that she did go to dermatology and they did not do a biopsy on her lip.  She reports headaches that began about 1 year ago that are bilateral and occur about once a month for which she takes Imitrex once a month.  She reports photosensitivity, nausea, and sensitivity to sound during the headaches.  She also reports headaches that are left-sided that began with a throbbing sensation through her left eye and staying about 1 hour.  These headaches occur 2-3 times a week and do not involve any lacrimation or ocular tearing.  She reports her lips have been burning and swelling with a dry scaly flaky rash that began about 6 months ago.  She has been to her primary care doctor and received a prednisone injection which cleared the rash on her lip.  She is also been to her  primary care provider and received oral prednisone which cleared the rash on her lip.  She also has received mometasone cream which cleared the rash on her lip.  When the rash initially appeared she bought all new cosmetics that were the same brand and all new cosmetic brushes.  She denies any fevers, sweats, chills, myalgias, or unintentional weight loss.  She does report her joints in her fingers are sore in the morning for about 1 hour for the last 2 months.   Drug Allergies:  Allergies  Allergen Reactions  . Amoxicillin     GI issues  . Codeine Other (See Comments)    Unsure - childhood allergy  . Lidocaine Hives    Physical Exam: BP 94/62 (BP Location: Left Arm, Patient Position: Sitting, Cuff Size: Normal)   Pulse 80   Resp 16   LMP 10/29/2017    Physical Exam  Constitutional: She is oriented to person, place, and time. She appears well-developed and well-nourished.  HENT:  Right Ear: External ear normal.  Left Ear: External ear normal.  Mouth/Throat: Oropharynx is clear and moist.  Eyes normal.  Ears normal.  Nares normal.  Pharynx normal.  Eyes: Conjunctivae are normal.  No pain to palpation of frontal or maxillary sinuses  Neck: Normal range of motion. Neck supple.  No pain to palpation of neck or lymph tissue.  Neck with full range of motion without pain  Cardiovascular:  Normal rate, regular rhythm and normal heart sounds.  S1-S2 normal.  Regular heart rate and rhythm.  No murmur noted.  Pulmonary/Chest: Effort normal and breath sounds normal.  Lungs clear to auscultation  Musculoskeletal: Normal range of motion.  Neurological: She is alert and oriented to person, place, and time.  Skin: Skin is warm and dry.  Psychiatric: She has a normal mood and affect. Her behavior is normal.      Assessment and Plan: 1. Facial swelling   2. Herpes labialis   3. Other allergic rhinitis   4. Migraine syndrome   5. Arthralgia, unspecified joint     No orders of the  defined types were placed in this encounter.   Patient Instructions  1. Facial swelling We will draw blood for further lab work. These will result in about 1 week. I will call you with the results as soon as I get them   2. Herpes labialis - You may continue with Valtrex as needed  3. Other allergic rhinitis - Antihistamine as needed - Consider nasal rinse  4. Migraine syndrome - Continue with Imitrex as needed for migraine   5. Arthralgia, unspecified joint - Referral to rheumatology specialists   Return in about 1 week (around 11/26/2017), or if symptoms worsen or fail to improve.    Thank you for the opportunity to care for this patient.  Please do not hesitate to contact me with questions.  Thermon LeylandAnne Jasalyn Frysinger, FNP [facility]   I have provided oversight concerning Thurston Holenne Amb's evaluation and treatment of this patient's health issues addressed during today's encounter.  I agree with the assessment and therapeutic plan as outlined in the note.   Signed,   R Jorene Guestarter Bobbitt, MD

## 2017-11-20 ENCOUNTER — Ambulatory Visit (INDEPENDENT_AMBULATORY_CARE_PROVIDER_SITE_OTHER): Payer: BLUE CROSS/BLUE SHIELD | Admitting: Endocrinology

## 2017-11-20 ENCOUNTER — Encounter: Payer: Self-pay | Admitting: Endocrinology

## 2017-11-20 VITALS — BP 104/62 | HR 76 | Wt 159.0 lb

## 2017-11-20 DIAGNOSIS — R519 Headache, unspecified: Secondary | ICD-10-CM

## 2017-11-20 DIAGNOSIS — R51 Headache: Secondary | ICD-10-CM

## 2017-11-20 DIAGNOSIS — E042 Nontoxic multinodular goiter: Secondary | ICD-10-CM

## 2017-11-20 NOTE — Progress Notes (Signed)
Subjective:    Patient ID: Shawna Chang, female    DOB: 03/21/1976, 42 y.o.   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 She does not notice the goiter.  Past Medical History:  Diagnosis Date  . Chronic joint pain   . Endometriosis   . Headache   . Migraine     Past Surgical History:  Procedure Laterality Date  . LAPAROSCOPIC ENDOMETRIOSIS FULGURATION      Social History   Socioeconomic History  . Marital status: Married    Spouse name: Not on file  . Number of children: 1  . Years of education: College  . Highest education level: Not on file  Social Needs  . Financial resource strain: Not on file  . Food insecurity - worry: Not on file  . Food insecurity - inability: Not on file  . Transportation needs - medical: Not on file  . Transportation needs - non-medical: Not on file  Occupational History  . Occupation: Clinical biochemistCustomer service  Tobacco Use  . Smoking status: Never Smoker  . Smokeless tobacco: Never Used  Substance and Sexual Activity  . Alcohol use: No    Alcohol/week: 0.0 oz  . Drug use: No  . Sexual activity: Not on file  Other Topics Concern  . Not on file  Social History Narrative   Lives at home with husband and daughter.   Right-handed.   No caffeine use.    Current Outpatient Medications on File Prior to Visit  Medication Sig Dispense Refill  . mometasone (ELOCON) 0.1 % ointment 1 application 2 times daily until resolved. 15 g 1  . SUMAtriptan (IMITREX) 50 MG tablet Take 1 tablet (50 mg total) by mouth every 2 (two) hours as needed for migraine. May repeat in 2 hours if headache persists or recurs. 12 tablet 6   No current facility-administered medications on file prior to visit.     Allergies  Allergen Reactions  . Amoxicillin     GI issues  . Codeine Other (See Comments)    Unsure - childhood allergy  . Lidocaine  Hives    Family History  Problem Relation Age of Onset  . Thyroid disease Mother   . Goiter Mother   . Thyroid disease Father   . Colon cancer Father   . Thyroid disease Sister   . Heart disease Paternal Grandmother   . Parkinson's disease Paternal Grandfather   . Breast cancer Paternal Aunt 50    BP 104/62 (BP Location: Left Arm, Patient Position: Sitting, Cuff Size: Normal)   Pulse 76   Wt 159 lb (72.1 kg)   LMP 10/29/2017   SpO2 95%   BMI 26.06 kg/m    Review of Systems Denies sob/dysphagia/numbness/skin ulcer.     Objective:   Physical Exam VITAL SIGNS:  See vs page GENERAL: no distress NECK: thyroid is slightly enlarged, with a irregular surface.   outside test results are reviewed: TSH=0.67  CT: Normally aerated paranasal sinuses.  Patent sinus drainage pathways    Assessment & Plan:  Multinodular goiter, due for recheck.  euthyroid  Patient Instructions  Let's recheck the ultrasound.  you will receive a phone call, about a day and time for an appointment. Please return in 2 more years.  most of the time, a "lumpy thyroid" will eventually become overactive.  this is usually a slow process, happening over the span  of many years.

## 2017-11-20 NOTE — Patient Instructions (Addendum)
Let's recheck the ultrasound.  you will receive a phone call, about a day and time for an appointment. Please return in 2 more years.  most of the time, a "lumpy thyroid" will eventually become overactive.  this is usually a slow process, happening over the span of many years.

## 2017-11-22 NOTE — Progress Notes (Signed)
Faxed referral

## 2017-11-25 ENCOUNTER — Telehealth: Payer: Self-pay

## 2017-11-25 NOTE — Telephone Encounter (Signed)
ERR

## 2017-11-26 DIAGNOSIS — L245 Irritant contact dermatitis due to other chemical products: Secondary | ICD-10-CM | POA: Diagnosis not present

## 2017-11-26 DIAGNOSIS — L509 Urticaria, unspecified: Secondary | ICD-10-CM | POA: Diagnosis not present

## 2017-11-26 LAB — C1 ESTERASE INHIBITOR, FUNCTIONAL: C1INH Functional/C1INH Total MFr SerPl: 96 %mean normal

## 2017-11-26 LAB — COMPLEMENT COMPONENT C1Q: Complement C1Q: 14.3 mg/dL (ref 11.8–24.4)

## 2017-11-26 LAB — C4 COMPLEMENT: Complement C4, Serum: 24 mg/dL (ref 14–44)

## 2017-11-26 LAB — C-REACTIVE PROTEIN: CRP: 0.6 mg/L (ref 0.0–4.9)

## 2017-12-03 ENCOUNTER — Telehealth: Payer: Self-pay

## 2017-12-03 NOTE — Telephone Encounter (Signed)
Pt advised of lab results and has scheduled an appointment for patch testing.

## 2017-12-09 ENCOUNTER — Ambulatory Visit
Admission: RE | Admit: 2017-12-09 | Discharge: 2017-12-09 | Disposition: A | Discharge status: Home / Self Care | Payer: BLUE CROSS/BLUE SHIELD | Source: Ambulatory Visit | Attending: Endocrinology | Admitting: Endocrinology

## 2017-12-09 ENCOUNTER — Encounter: Payer: Self-pay | Admitting: Allergy and Immunology

## 2017-12-09 ENCOUNTER — Ambulatory Visit (INDEPENDENT_AMBULATORY_CARE_PROVIDER_SITE_OTHER): Payer: BLUE CROSS/BLUE SHIELD | Admitting: Allergy and Immunology

## 2017-12-09 VITALS — BP 106/64 | HR 70 | Wt 164.0 lb

## 2017-12-09 DIAGNOSIS — L235 Allergic contact dermatitis due to other chemical products: Secondary | ICD-10-CM | POA: Diagnosis not present

## 2017-12-09 DIAGNOSIS — L259 Unspecified contact dermatitis, unspecified cause: Secondary | ICD-10-CM | POA: Insufficient documentation

## 2017-12-09 DIAGNOSIS — E042 Nontoxic multinodular goiter: Secondary | ICD-10-CM | POA: Diagnosis not present

## 2017-12-09 NOTE — Assessment & Plan Note (Signed)
   T.R.U.E. patch test panel has been placed.  Instructions have been provided regarding keeping the patches clean and dry as well as returning at appropriate intervals for patch test interpretation. 

## 2017-12-09 NOTE — Progress Notes (Signed)
    Follow-up Note  RE: Shawna Chang MRN: [id number] DOB: 11/03/1976 Date of Office Visit: 12/09/2017  Primary care provider: Maurice SmallGriffin, Elaine, MD Referring provider: Maurice SmallGriffin, Elaine, MD  History of present illness: Shawna Chang is a 42 y.o. female presenting today for patch test placement.  Previously seen November 19, 2017 by Thermon LeylandAnne Ambs, FNP.   Assessment and plan: Dermatitis, contact  T.R.U.E. patch test panel has been placed.  Instructions have been provided regarding keeping the patches clean and dry as well as returning at appropriate intervals for patch test interpretation.    Diagnostics: Patch test have been placed.    Physical examination: Blood pressure 106/64, pulse 70, weight 164 lb (74.4 kg), SpO2 99 %.  General: Alert, interactive, in no acute distress. Neck: Supple without lymphadenopathy. Lungs: Clear to auscultation without wheezing, rhonchi or rales. CV: Normal S1, S2 without murmurs. Skin: Warm and dry, without lesions or rashes.  The following portions of the patient's history were reviewed and updated as appropriate: allergies, current medications, past family history, past medical history, past social history, past surgical history and problem list.  Allergies as of 12/09/2017      Reactions   Amoxicillin    GI issues   Codeine Other (See Comments)   Unsure - childhood allergy   Lidocaine Hives      Medication List        Accurate as of 12/09/17  1:35 PM. Always use your most recent med list.          mometasone 0.1 % ointment Commonly known as:  ELOCON 1 application 2 times daily until resolved.   SUMAtriptan 50 MG tablet Commonly known as:  IMITREX Take 1 tablet (50 mg total) by mouth every 2 (two) hours as needed for migraine. May repeat in 2 hours if headache persists or recurs.       Allergies  Allergen Reactions  . Amoxicillin     GI issues  . Codeine Other (See Comments)    Unsure - childhood allergy  . Lidocaine  Hives    I appreciate the opportunity to take part in Shawna Chang's care. Please do not hesitate to contact me with questions.  Sincerely,   R. Jorene Guestarter Patrich Heinze, MD

## 2017-12-09 NOTE — Patient Instructions (Signed)
Dermatitis, contact  T.R.U.E. patch test panel has been placed.  Instructions have been provided regarding keeping the patches clean and dry as well as returning at appropriate intervals for patch test interpretation.   Return in about 2 days (around 12/11/2017) for Patch test reading.

## 2017-12-11 ENCOUNTER — Telehealth: Payer: Self-pay

## 2017-12-11 ENCOUNTER — Encounter: Payer: Self-pay | Admitting: Family Medicine

## 2017-12-11 ENCOUNTER — Ambulatory Visit: Payer: BLUE CROSS/BLUE SHIELD | Admitting: Family Medicine

## 2017-12-11 DIAGNOSIS — L235 Allergic contact dermatitis due to other chemical products: Secondary | ICD-10-CM

## 2017-12-11 NOTE — Telephone Encounter (Signed)
-----   Message from Romero BellingSean Ellison, MD sent at 12/09/2017  6:17 PM EST ----- please call patient: No change--good.

## 2017-12-11 NOTE — Progress Notes (Signed)
   [address] [place] [address] Dept: [phone]  FOLLOW UP NOTE  Patient ID: Shawna ChamberShawna S Peart, female    DOB: 04/03/1976  Age: 42 y.o. MRN: [id number] Date of Office Visit: 12/11/2017  Assessment  Chief Complaint: Other and Allergy Testing  HPI Shawna ChamberShawna S [place] presents for a 48 hour patch test reading, given suspected history of contact dermatitis.     Drug Allergies:  Allergies  Allergen Reactions  . Amoxicillin     GI issues  . Codeine Other (See Comments)    Unsure - childhood allergy  . Lidocaine Hives    Diagnostics:   TRUE TEST 48-hour hour reading: possible reaction to #10 (Balsam of [place]), probable reaction to #16 Medical sales representative(Black rubber mix), probable reaction to #17 (Cl+Me-Isothiazolinone), possible reaction to #19 (Methyldibromo Glutaronitrile), probsble reaction to #23 (Thiomersal), probsble reaction to #24 (Thiuram mix), probable reaction to #25 (Diazolidinyl urea) and probable reaction to #29 (Imidazolidinyl urea)  Plan:   Allergic contact dermatitis - The patient has been provided detailed information regarding the substances she is sensitive to, as well as products containing the substances.   - Meticulous avoidance of these substances is recommended.  - If avoidance is not possible, the use of barrier creams or lotions is recommended. - If symptoms persist or progress despite meticulous avoidance of the items listed above, Dermatology Referral may be warranted.  Thank you for the opportunity to care for this patient.  Please do not hesitate to contact me with questions.  Thermon LeylandAnne Dontrae Morini, FNP [facility]

## 2017-12-11 NOTE — Telephone Encounter (Signed)
Called pt. No answer.  Left vmail per DPR.  

## 2017-12-11 NOTE — Patient Instructions (Signed)
Allergic contact dermatitis - The patient has been provided detailed information regarding the substances she is sensitive to, as well as products containing the substances.   - Meticulous avoidance of these substances is recommended.  - If avoidance is not possible, the use of barrier creams or lotions is recommended. - If symptoms persist or progress despite meticulous avoidance of the items listed above, Dermatology Referral may be warranted.

## 2017-12-13 ENCOUNTER — Encounter: Payer: Self-pay | Admitting: Allergy

## 2017-12-13 ENCOUNTER — Ambulatory Visit (INDEPENDENT_AMBULATORY_CARE_PROVIDER_SITE_OTHER): Payer: BLUE CROSS/BLUE SHIELD | Admitting: Allergy

## 2017-12-13 VITALS — BP 100/60 | HR 80 | Resp 16

## 2017-12-13 DIAGNOSIS — L235 Allergic contact dermatitis due to other chemical products: Secondary | ICD-10-CM

## 2017-12-13 NOTE — Progress Notes (Signed)
    Follow-up Note  RE: Shawna Chang MRN: [id number] DOB: 09/24/1976 Date of Office Visit: 12/13/2017  Primary care provider: Maurice SmallGriffin, Elaine, MD Referring provider: Maurice SmallGriffin, Elaine, MD   Artist PaisShawna returns to the office today for the final patch test interpretation, given suspected history of contact dermatitis.    Diagnostics:  TRUE TEST 96 hour reading:  #1 with erythematous papules.   #23, 25 and 29 are positive today with erythematous square.    48hr reading:  possible reaction to #10 (Balsam of [place]), probable reaction to #16 Medical sales representative(Black rubber mix), probable reaction to #17 (Cl+Me-Isothiazolinone), possible reaction to #19 (Methyldibromo Glutaronitrile), probsble reaction to #23 (Thiomersal), probsble reaction to #24 (Thiuram mix), probable reaction to #25 (Diazolidinyl urea) and probable reaction to #29 (Imidazolidinyl urea)  Plan:  Allergic contact dermatitis  The patient has been provided detailed information regarding the substances she is sensitive to, as well as products containing the substances.  Meticulous avoidance of these substances is recommended. If avoidance is not possible, the use of barrier creams or lotions is recommended. If symptoms persist or progress despite meticulous avoidance of above substances, dermatology evaluation may be warranted.     Margo AyeShaylar Padgett, MD [facility]

## 2017-12-16 DIAGNOSIS — M545 Low back pain: Secondary | ICD-10-CM | POA: Diagnosis not present

## 2017-12-16 DIAGNOSIS — M25559 Pain in unspecified hip: Secondary | ICD-10-CM | POA: Diagnosis not present

## 2017-12-16 DIAGNOSIS — M79641 Pain in right hand: Secondary | ICD-10-CM | POA: Diagnosis not present

## 2017-12-16 DIAGNOSIS — M940 Chondrocostal junction syndrome [Tietze]: Secondary | ICD-10-CM | POA: Diagnosis not present

## 2017-12-16 DIAGNOSIS — M25562 Pain in left knee: Secondary | ICD-10-CM | POA: Diagnosis not present

## 2017-12-16 DIAGNOSIS — M25561 Pain in right knee: Secondary | ICD-10-CM | POA: Diagnosis not present

## 2017-12-16 DIAGNOSIS — M79642 Pain in left hand: Secondary | ICD-10-CM | POA: Diagnosis not present

## 2017-12-16 DIAGNOSIS — M064 Inflammatory polyarthropathy: Secondary | ICD-10-CM | POA: Diagnosis not present

## 2017-12-16 DIAGNOSIS — I7300 Raynaud's syndrome without gangrene: Secondary | ICD-10-CM | POA: Diagnosis not present

## 2018-01-08 ENCOUNTER — Ambulatory Visit: Payer: BLUE CROSS/BLUE SHIELD

## 2018-01-16 DIAGNOSIS — M2550 Pain in unspecified joint: Secondary | ICD-10-CM | POA: Diagnosis not present

## 2018-01-16 DIAGNOSIS — M791 Myalgia, unspecified site: Secondary | ICD-10-CM | POA: Diagnosis not present

## 2018-01-16 DIAGNOSIS — M545 Low back pain: Secondary | ICD-10-CM | POA: Diagnosis not present

## 2018-01-16 DIAGNOSIS — I7300 Raynaud's syndrome without gangrene: Secondary | ICD-10-CM | POA: Diagnosis not present

## 2018-01-23 ENCOUNTER — Ambulatory Visit
Admission: RE | Admit: 2018-01-23 | Discharge: 2018-01-23 | Disposition: A | Discharge status: Home / Self Care | Payer: BLUE CROSS/BLUE SHIELD | Source: Ambulatory Visit | Attending: Obstetrics and Gynecology | Admitting: Obstetrics and Gynecology

## 2018-01-23 DIAGNOSIS — Z1231 Encounter for screening mammogram for malignant neoplasm of breast: Secondary | ICD-10-CM

## 2018-03-11 DIAGNOSIS — L245 Irritant contact dermatitis due to other chemical products: Secondary | ICD-10-CM | POA: Diagnosis not present

## 2018-03-11 DIAGNOSIS — D2239 Melanocytic nevi of other parts of face: Secondary | ICD-10-CM | POA: Diagnosis not present

## 2018-03-11 DIAGNOSIS — D225 Melanocytic nevi of trunk: Secondary | ICD-10-CM | POA: Diagnosis not present

## 2018-03-11 DIAGNOSIS — D2262 Melanocytic nevi of left upper limb, including shoulder: Secondary | ICD-10-CM | POA: Diagnosis not present

## 2018-05-12 DIAGNOSIS — Z01419 Encounter for gynecological examination (general) (routine) without abnormal findings: Secondary | ICD-10-CM | POA: Diagnosis not present

## 2018-05-12 DIAGNOSIS — Z1151 Encounter for screening for human papillomavirus (HPV): Secondary | ICD-10-CM | POA: Diagnosis not present

## 2018-05-12 DIAGNOSIS — Z6823 Body mass index (BMI) 23.0-23.9, adult: Secondary | ICD-10-CM | POA: Diagnosis not present

## 2018-05-20 ENCOUNTER — Encounter: Payer: Self-pay | Admitting: Neurology

## 2018-05-20 ENCOUNTER — Ambulatory Visit (INDEPENDENT_AMBULATORY_CARE_PROVIDER_SITE_OTHER): Payer: BLUE CROSS/BLUE SHIELD | Admitting: Neurology

## 2018-05-20 VITALS — BP 111/65 | HR 95 | Ht 65.5 in | Wt 160.0 lb

## 2018-05-20 DIAGNOSIS — G43709 Chronic migraine without aura, not intractable, without status migrainosus: Secondary | ICD-10-CM | POA: Diagnosis not present

## 2018-05-20 DIAGNOSIS — IMO0002 Reserved for concepts with insufficient information to code with codable children: Secondary | ICD-10-CM | POA: Insufficient documentation

## 2018-05-20 MED ORDER — TIZANIDINE HCL 4 MG PO TABS: 4.0000 mg | ORAL_TABLET | Freq: Four times a day (QID) | ORAL | 3 refills | Status: AC | PRN

## 2018-05-20 MED ORDER — ONDANSETRON HCL 4 MG PO TABS: 4.0000 mg | ORAL_TABLET | Freq: Three times a day (TID) | ORAL | 3 refills | Status: DC | PRN

## 2018-05-20 NOTE — Progress Notes (Signed)
PATIENT: Shawna Chang DOB: 02/14/1976  Chief Complaint  Patient presents with  . Migraine    She is here for her yearly follow up. Reports at least two days of severe headaches around her menstrual cycle each month.  Imitrex is no longer very helpful for her pain.      HISTORICAL  Shawna Chang is a 42 years old right-handed female, seen in refer by her primary care doctor Maurice SmallGriffin, Elaine and Dr. Olivia Mackieaavon, Richard, for evaluation of chronic migraine headaches, initial evaluation was July 11 2017.  I reviewed and summarized the referring note, she has left ear fluid, otherwise healthy, she began to notice almost monthly headaches since February 2018, it started before her menstruation cycle, sometimes preceded by seeing sparkling in her visual field, with lateralized moderate pounding headache, light noise sensitivity, nauseous, lasting for couple hours, improved by lying in dark quiet room,  She has tried over-the-counter Tylenol, Excedrin Migraine with limited help, Aleve plus caffeine was helpful,  UPDATE May 20 2018: She now has monthly headache around her menstruation period of time, left side retrorbital area severe pounding headache with associated light noise sensitivity.   REVIEW OF SYSTEMS: Full 14 system review of systems performed and notable only for fatigue, unexpected weight change, murmur, frequent awakening, memory loss, headaches  ALLERGIES: Allergies  Allergen Reactions  . Amoxicillin     GI issues  . Codeine Other (See Comments)    Unsure - childhood allergy  . Lidocaine Hives    HOME MEDICATIONS: Current Outpatient Medications  Medication Sig Dispense Refill  . mometasone (ELOCON) 0.1 % ointment 1 application 2 times daily until resolved. 15 g 1  . SUMAtriptan (IMITREX) 50 MG tablet Take 1 tablet (50 mg total) by mouth every 2 (two) hours as needed for migraine. May repeat in 2 hours if headache persists or recurs. 12 tablet 6   No current  facility-administered medications for this visit.     PAST MEDICAL HISTORY: Past Medical History:  Diagnosis Date  . Chronic joint pain   . Endometriosis   . Headache   . Migraine     PAST SURGICAL HISTORY: Past Surgical History:  Procedure Laterality Date  . LAPAROSCOPIC ENDOMETRIOSIS FULGURATION      FAMILY HISTORY: Family History  Problem Relation Age of Onset  . Thyroid disease Mother   . Goiter Mother   . Thyroid disease Father   . Colon cancer Father   . Thyroid disease Sister   . Heart disease Paternal Grandmother   . Parkinson's disease Paternal Grandfather   . Breast cancer Paternal Aunt [id number]    SOCIAL HISTORY:  Social History   Socioeconomic History  . Marital status: Married    Spouse name: Not on file  . Number of children: 1  . Years of education: College  . Highest education level: Not on file  Occupational History  . Occupation: Clinical biochemistCustomer service  Social Needs  . Financial resource strain: Not on file  . Food insecurity:    Worry: Not on file    Inability: Not on file  . Transportation needs:    Medical: Not on file    Non-medical: Not on file  Tobacco Use  . Smoking status: Never Smoker  . Smokeless tobacco: Never Used  Substance and Sexual Activity  . Alcohol use: No    Alcohol/week: 0.0 oz  . Drug use: No  . Sexual activity: Not on file  Lifestyle  . Physical activity:  Days per week: Not on file    Minutes per session: Not on file  . Stress: Not on file  Relationships  . Social connections:    Talks on phone: Not on file    Gets together: Not on file    Attends religious service: Not on file    Active member of club or organization: Not on file    Attends meetings of clubs or organizations: Not on file    Relationship status: Not on file  . Intimate partner violence:    Fear of current or ex partner: Not on file    Emotionally abused: Not on file    Physically abused: Not on file    Forced sexual activity: Not on file    Other Topics Concern  . Not on file  Social History Narrative   Lives at home with husband and daughter.   Right-handed.   No caffeine use.     PHYSICAL EXAM   Vitals:   05/20/18 1241  BP: 111/65  Pulse: 95  Weight: 160 lb (72.6 kg)  Height: 5' 5.5" (1.664 m)    Not recorded      Body mass index is 26.22 kg/m.  PHYSICAL EXAMNIATION:  Gen: NAD, conversant, well nourised, obese, well groomed                     Cardiovascular: Regular rate rhythm, no peripheral edema, warm, nontender. Eyes: Conjunctivae clear without exudates or hemorrhage Neck: Supple, no carotid bruits. Pulmonary: Clear to auscultation bilaterally   NEUROLOGICAL EXAM:  MENTAL STATUS: Speech:    Speech is normal; fluent and spontaneous with normal comprehension.  Cognition:     Orientation to time, place and person     Normal recent and remote memory     Normal Attention span and concentration     Normal Language, naming, repeating,spontaneous speech     Fund of knowledge   CRANIAL NERVES: CN II: Visual fields are full to confrontation. Fundoscopic exam is normal with sharp discs and no vascular changes. Pupils are round equal and briskly reactive to light. CN III, IV, VI: extraocular movement are normal. No ptosis. CN V: Facial sensation is intact to pinprick in all 3 divisions bilaterally. Corneal responses are intact.  CN VII: Face is symmetric with normal eye closure and smile. CN VIII: Hearing is normal to rubbing fingers CN IX, X: Palate elevates symmetrically. Phonation is normal. CN XI: Head turning and shoulder shrug are intact CN XII: Tongue is midline with normal movements and no atrophy.  MOTOR: There is no pronator drift of out-stretched arms. Muscle bulk and tone are normal. Muscle strength is normal.  REFLEXES: Reflexes are 2+ and symmetric at the biceps, triceps, knees, and ankles. Plantar responses are flexor.  SENSORY: Intact to light touch, pinprick, positional  sensation and vibratory sensation are intact in fingers and toes.  COORDINATION: Rapid alternating movements and fine finger movements are intact. There is no dysmetria on finger-to-nose and heel-knee-shin.    GAIT/STANCE: Posture is normal. Gait is steady with normal steps, base, arm swing, and turning. Heel and toe walking are normal. Tandem gait is normal.  Romberg is absent.   DIAGNOSTIC DATA (LABS, IMAGING, TESTING) - I reviewed patient records, labs, notes, testing and imaging myself where available.   ASSESSMENT AND PLAN  ROHINI JAROSZEWSKI is a 42 y.o. female   Chronic migraine  Imitrex 100 mg as needed  She may combine it with Aleve, tizanidine, Zofran,  Return to clinic in 6 months with nurse practitioner   Levert Feinstein, M.D. Ph.D.  [facility] [address] [place], [place] [address] Ph: [phone] Fax: [phone]  CC: Maurice Small, MD, Olivia Mackie, MD

## 2018-07-19 ENCOUNTER — Other Ambulatory Visit: Payer: Self-pay | Admitting: Neurology

## 2018-08-04 DIAGNOSIS — Z23 Encounter for immunization: Secondary | ICD-10-CM | POA: Diagnosis not present

## 2018-08-21 DIAGNOSIS — Z0000 Encounter for general adult medical examination without abnormal findings: Secondary | ICD-10-CM | POA: Diagnosis not present

## 2018-08-21 DIAGNOSIS — E042 Nontoxic multinodular goiter: Secondary | ICD-10-CM | POA: Diagnosis not present

## 2018-08-21 DIAGNOSIS — K5900 Constipation, unspecified: Secondary | ICD-10-CM | POA: Diagnosis not present

## 2018-11-19 IMAGING — CT CT MAXILLOFACIAL W/O CM
2 of 3 series · 15 of 37 positions shown, 18 images · non-contrast
Comparison: None.

CLINICAL DATA: 40 y/o F; sinusitis, cough, drainage, sore throat,
and post antibiotic therapy.

EXAM:
CT MAXILLOFACIAL WITHOUT CONTRAST
TECHNIQUE: Multidetector CT images of the paranasal sinuses were obtained using
the standard protocol without intravenous contrast.

[Series 601: coronal facial · coronal · 0.36mm/px · 3 of 89 slices shown]
[im 30/89  bone]
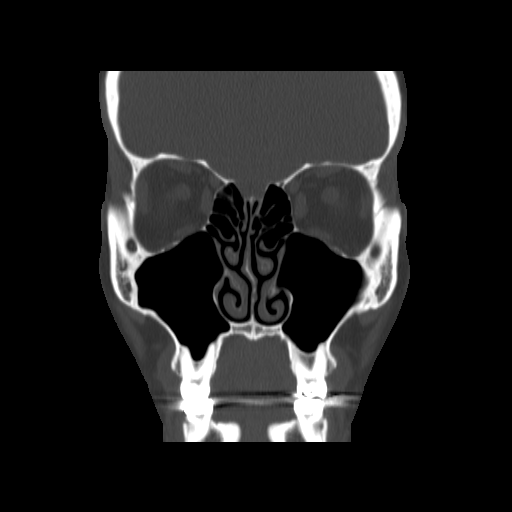
[im 45/89  bone]
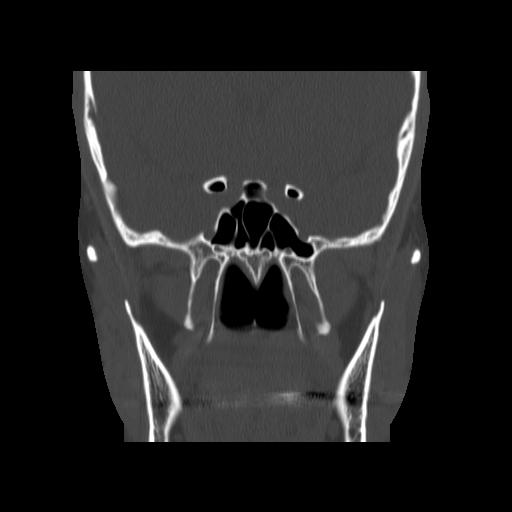
[im 59/89  bone]
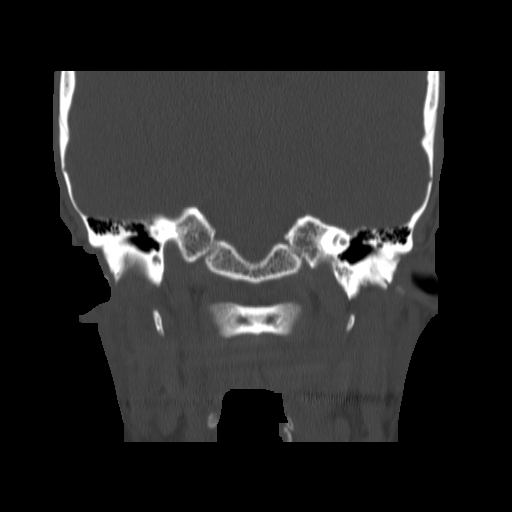

[Series 602: sagittal facial · sagittal · 0.36mm/px · 12 of 85 slices shown, 15 images]
[im 5/85  brain]
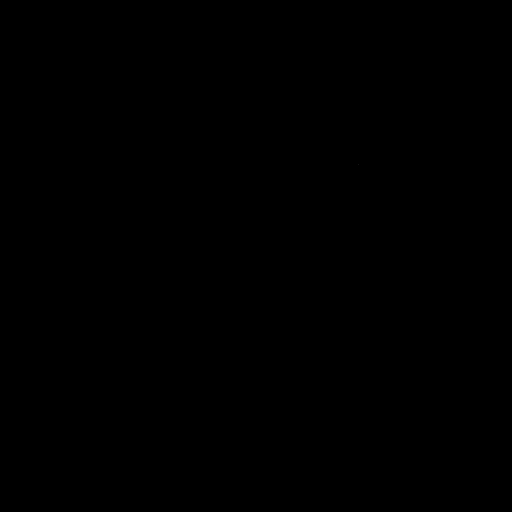
[im 5/85  bone]
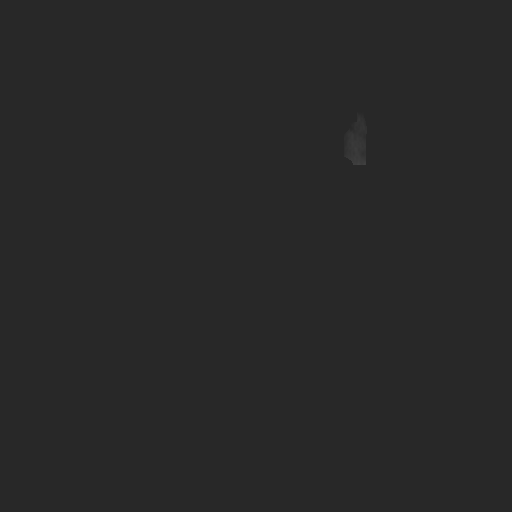
[im 15/85  bone]
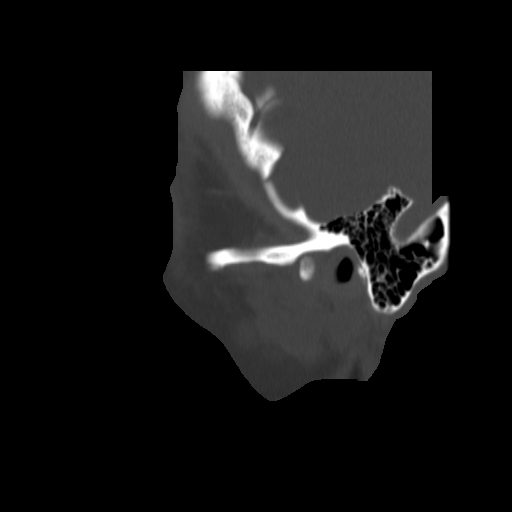
[im 20/85  bone]
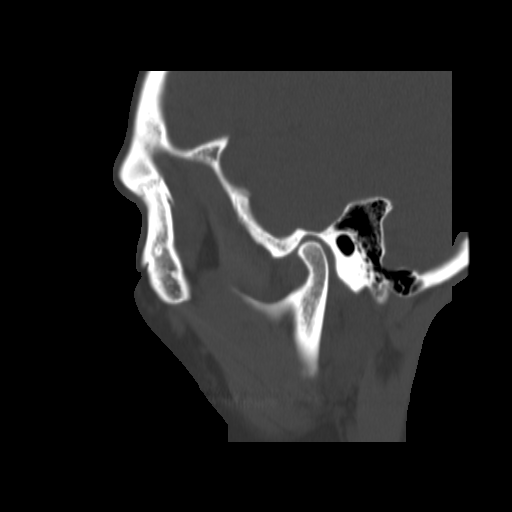
[im 25/85  bone]
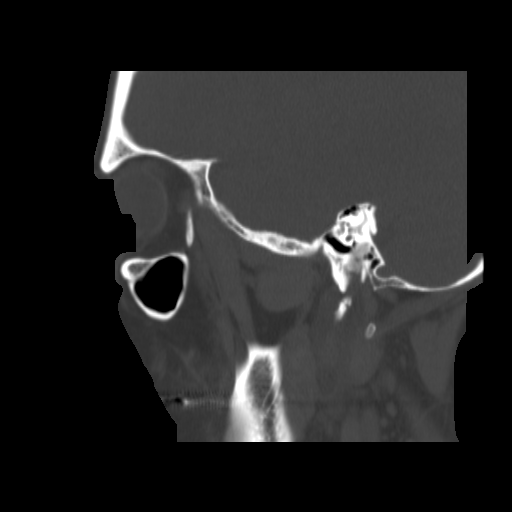
[im 35/85  brain]
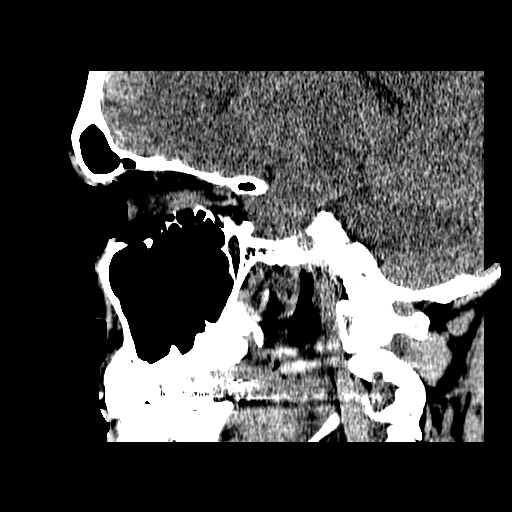
[im 35/85  bone]
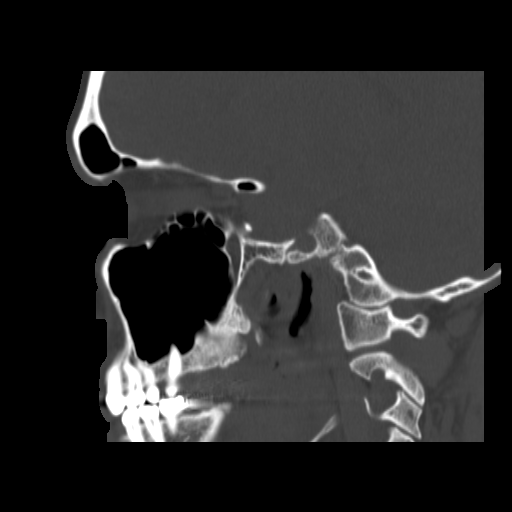
[im 40/85  bone]
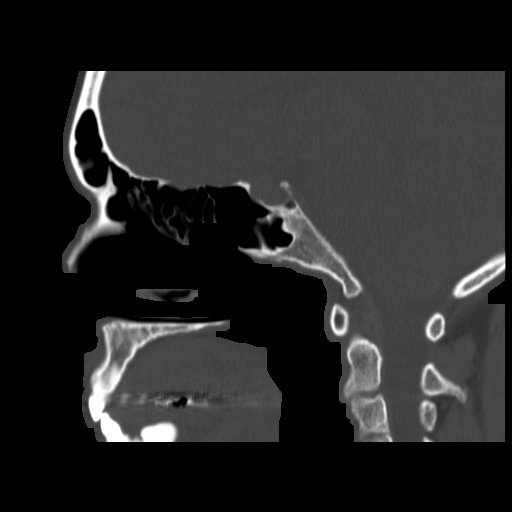
[im 45/85  bone]
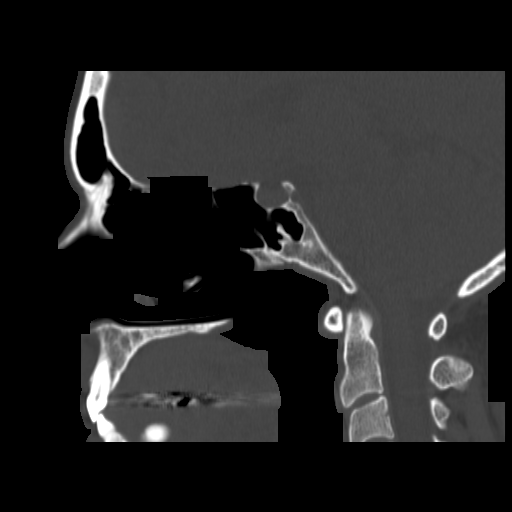
[im 55/85  bone]
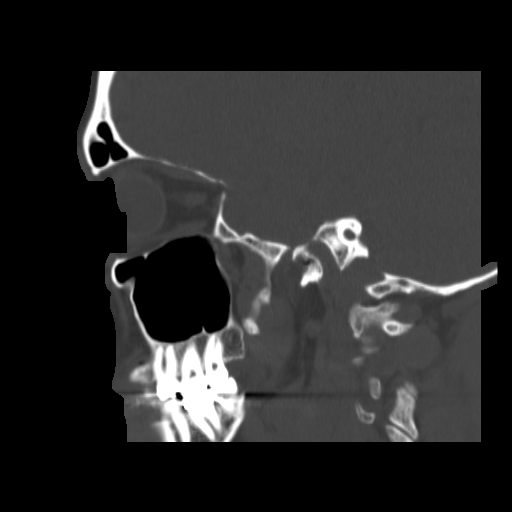
[im 60/85  brain]
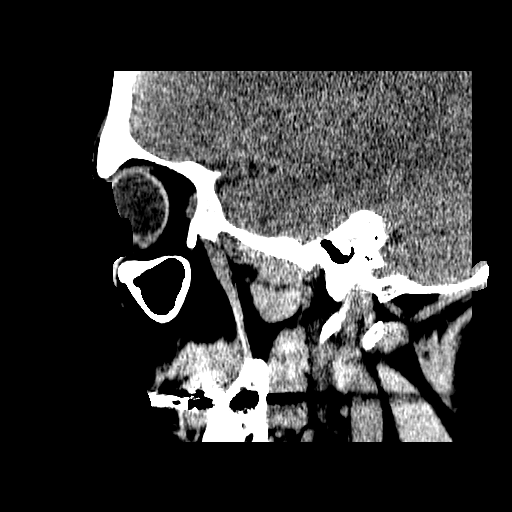
[im 60/85  bone]
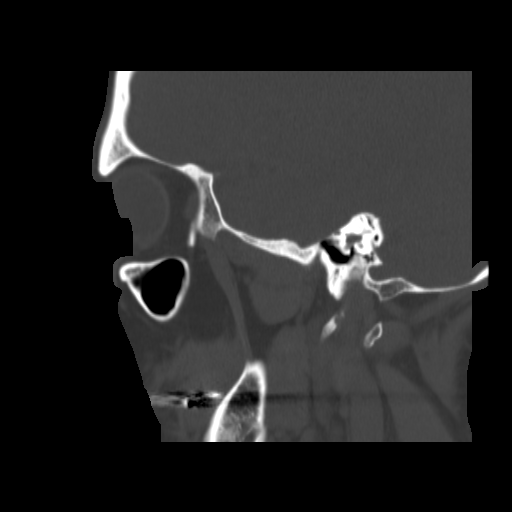
[im 65/85  bone]
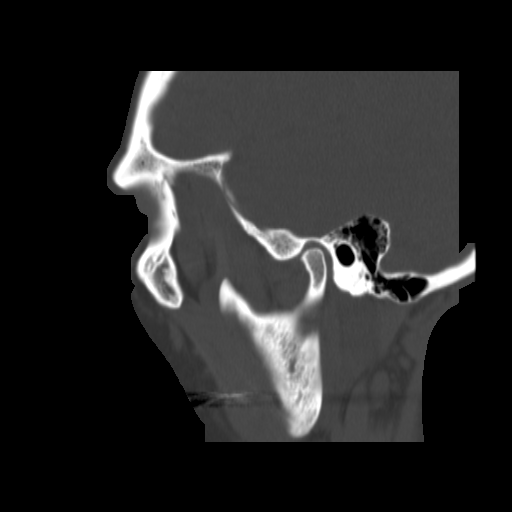
[im 75/85  bone]
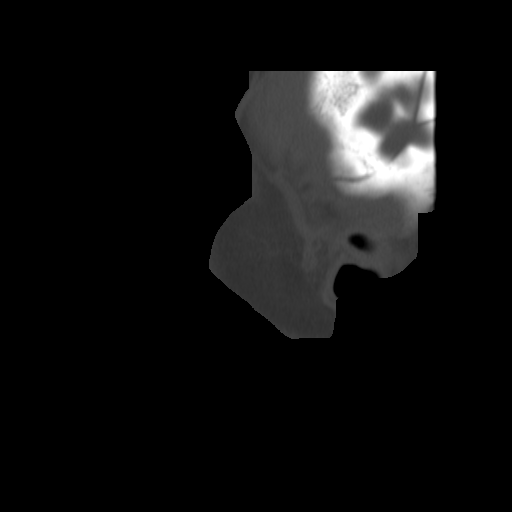
[im 80/85  bone]
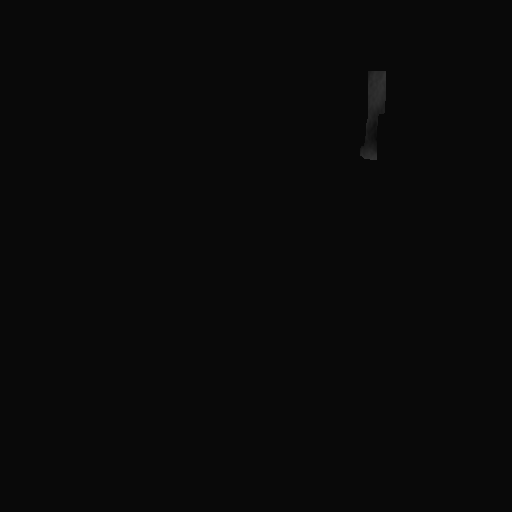

[15 of 37 positions shown; findings below may reference images not displayed]

FINDINGS: Paranasal sinuses:

Frontal: Normally aerated. Patent frontal sinus drainage pathways.

Ethmoid: Normally aerated.

Maxillary: Normally aerated.

Sphenoid: Normally aerated. Patent sphenoethmoidal recesses.

Right ostiomeatal unit: Patent.

Left ostiomeatal unit: Patent.

Nasal passages: Patent. Intact nasal septum is midline.

Anatomy: Pneumatization superior to anterior ethmoid notches
bilaterally. Symmetric and intact olfactory grooves and fovea
ethmoidalis, Keros III (8-16mm). Sellar sphenoid pneumatization
pattern. Possible bilateral paraclinoid dehiscence of carotid canals
into the upper posterior sphenoid sinus (series 601, image 45).

Other: Orbits and intracranial compartment are unremarkable. Visible
mastoid air cells are normally aerated.
IMPRESSION: Normally aerated paranasal sinuses.  Patent sinus drainage pathways.

By: Jarismar Sander M.D.

## 2018-12-01 ENCOUNTER — Ambulatory Visit: Payer: BLUE CROSS/BLUE SHIELD | Admitting: Nurse Practitioner

## 2018-12-15 ENCOUNTER — Other Ambulatory Visit: Payer: Self-pay | Admitting: Obstetrics and Gynecology

## 2018-12-15 DIAGNOSIS — Z1231 Encounter for screening mammogram for malignant neoplasm of breast: Secondary | ICD-10-CM

## 2018-12-24 DIAGNOSIS — L299 Pruritus, unspecified: Secondary | ICD-10-CM | POA: Diagnosis not present

## 2018-12-24 DIAGNOSIS — L249 Irritant contact dermatitis, unspecified cause: Secondary | ICD-10-CM | POA: Diagnosis not present

## 2019-01-01 ENCOUNTER — Ambulatory Visit: Payer: Self-pay | Admitting: Nurse Practitioner

## 2019-01-30 ENCOUNTER — Ambulatory Visit: Payer: BLUE CROSS/BLUE SHIELD

## 2019-03-27 ENCOUNTER — Ambulatory Visit: Payer: Self-pay

## 2019-04-03 DIAGNOSIS — M172 Bilateral post-traumatic osteoarthritis of knee: Secondary | ICD-10-CM | POA: Diagnosis not present

## 2019-04-27 ENCOUNTER — Other Ambulatory Visit: Payer: Self-pay

## 2019-04-27 ENCOUNTER — Ambulatory Visit
Admission: RE | Admit: 2019-04-27 | Discharge: 2019-04-27 | Disposition: A | Discharge status: Home / Self Care | Payer: BC Managed Care – PPO | Source: Ambulatory Visit | Attending: Obstetrics and Gynecology | Admitting: Obstetrics and Gynecology

## 2019-04-27 DIAGNOSIS — Z1231 Encounter for screening mammogram for malignant neoplasm of breast: Secondary | ICD-10-CM | POA: Diagnosis not present

## 2019-05-11 DIAGNOSIS — D2261 Melanocytic nevi of right upper limb, including shoulder: Secondary | ICD-10-CM | POA: Diagnosis not present

## 2019-05-11 DIAGNOSIS — L821 Other seborrheic keratosis: Secondary | ICD-10-CM | POA: Diagnosis not present

## 2019-05-11 DIAGNOSIS — D2372 Other benign neoplasm of skin of left lower limb, including hip: Secondary | ICD-10-CM | POA: Diagnosis not present

## 2019-05-11 DIAGNOSIS — D2262 Melanocytic nevi of left upper limb, including shoulder: Secondary | ICD-10-CM | POA: Diagnosis not present

## 2019-06-24 DIAGNOSIS — Z01419 Encounter for gynecological examination (general) (routine) without abnormal findings: Secondary | ICD-10-CM | POA: Diagnosis not present

## 2019-06-24 DIAGNOSIS — Z6825 Body mass index (BMI) 25.0-25.9, adult: Secondary | ICD-10-CM | POA: Diagnosis not present

## 2019-08-12 DIAGNOSIS — N950 Postmenopausal bleeding: Secondary | ICD-10-CM | POA: Diagnosis not present

## 2019-08-20 DIAGNOSIS — N938 Other specified abnormal uterine and vaginal bleeding: Secondary | ICD-10-CM | POA: Diagnosis not present

## 2019-08-20 DIAGNOSIS — N939 Abnormal uterine and vaginal bleeding, unspecified: Secondary | ICD-10-CM | POA: Diagnosis not present

## 2019-09-03 ENCOUNTER — Other Ambulatory Visit: Payer: Self-pay | Admitting: Neurology

## 2019-09-03 DIAGNOSIS — K219 Gastro-esophageal reflux disease without esophagitis: Secondary | ICD-10-CM | POA: Diagnosis not present

## 2019-09-03 DIAGNOSIS — Z131 Encounter for screening for diabetes mellitus: Secondary | ICD-10-CM | POA: Diagnosis not present

## 2019-09-03 DIAGNOSIS — Z8249 Family history of ischemic heart disease and other diseases of the circulatory system: Secondary | ICD-10-CM | POA: Diagnosis not present

## 2019-09-03 DIAGNOSIS — Z23 Encounter for immunization: Secondary | ICD-10-CM | POA: Diagnosis not present

## 2019-09-03 DIAGNOSIS — Z0000 Encounter for general adult medical examination without abnormal findings: Secondary | ICD-10-CM | POA: Diagnosis not present

## 2019-09-03 DIAGNOSIS — E042 Nontoxic multinodular goiter: Secondary | ICD-10-CM | POA: Diagnosis not present

## 2019-09-28 ENCOUNTER — Other Ambulatory Visit: Payer: Self-pay | Admitting: Neurology

## 2019-10-01 DIAGNOSIS — G43909 Migraine, unspecified, not intractable, without status migrainosus: Secondary | ICD-10-CM | POA: Diagnosis not present

## 2019-10-01 DIAGNOSIS — M797 Fibromyalgia: Secondary | ICD-10-CM | POA: Diagnosis not present

## 2019-10-01 DIAGNOSIS — N951 Menopausal and female climacteric states: Secondary | ICD-10-CM | POA: Diagnosis not present

## 2019-10-04 DIAGNOSIS — J3489 Other specified disorders of nose and nasal sinuses: Secondary | ICD-10-CM | POA: Diagnosis not present

## 2019-10-04 DIAGNOSIS — Z20828 Contact with and (suspected) exposure to other viral communicable diseases: Secondary | ICD-10-CM | POA: Diagnosis not present

## 2019-11-16 ENCOUNTER — Other Ambulatory Visit: Payer: Self-pay

## 2019-11-16 ENCOUNTER — Ambulatory Visit (INDEPENDENT_AMBULATORY_CARE_PROVIDER_SITE_OTHER): Payer: BC Managed Care – PPO | Admitting: Neurology

## 2019-11-16 ENCOUNTER — Encounter: Payer: Self-pay | Admitting: Neurology

## 2019-11-16 VITALS — BP 98/60 | HR 70 | Temp 97.0°F | Ht 65.5 in | Wt 159.5 lb

## 2019-11-16 DIAGNOSIS — IMO0002 Reserved for concepts with insufficient information to code with codable children: Secondary | ICD-10-CM

## 2019-11-16 DIAGNOSIS — G43709 Chronic migraine without aura, not intractable, without status migrainosus: Secondary | ICD-10-CM | POA: Diagnosis not present

## 2019-11-16 MED ORDER — SUMATRIPTAN SUCCINATE 50 MG PO TABS: ORAL_TABLET | ORAL | 11 refills | Status: DC

## 2019-11-16 MED ORDER — ONDANSETRON HCL 4 MG PO TABS: 4.0000 mg | ORAL_TABLET | Freq: Three times a day (TID) | ORAL | 11 refills | Status: DC | PRN

## 2019-11-16 NOTE — Progress Notes (Signed)
PATIENT: Shawna Chang DOB: November 25, 1975  Chief Complaint  Patient presents with  . Migraine    She gets migraines with her menstrual cycles.  Her pain responds well to sumatriptan.     HISTORICAL  Shawna Chang is a 44 years old right-handed female, seen in refer by her primary care doctor Maurice Small and Dr. Olivia Mackie, for evaluation of chronic migraine headaches, initial evaluation was July 11 2017.  I reviewed and summarized the referring note, she has left ear fluid, otherwise healthy, she began to notice almost monthly headaches since February 2018, it started before her menstruation cycle, sometimes preceded by seeing sparkling in her visual field, with lateralized moderate pounding headache, light noise sensitivity, nauseous, lasting for couple hours, improved by lying in dark quiet room,  She has tried over-the-counter Tylenol, Excedrin Migraine with limited help, Aleve plus caffeine was helpful,  UPDATE May 20 2018: She now has monthly headache around her menstruation period of time, left side retrorbital area severe pounding headache with associated light noise sensitivity.  UPDATE Jan 4th 2021: She is overall doing well, only has couple migraines every month around her menstruation period of time   REVIEW OF SYSTEMS: Full 14 system review of systems performed and notable only for as above  ALLERGIES: Allergies  Allergen Reactions  . Amoxicillin     GI issues  . Codeine Other (See Comments)    Unsure - childhood allergy  . Lidocaine Hives    HOME MEDICATIONS: Current Outpatient Medications  Medication Sig Dispense Refill  . DULoxetine (CYMBALTA) 30 MG capsule Take 30 mg by mouth daily.    . mometasone (ELOCON) 0.1 % ointment 1 application 2 times daily until resolved. 15 g 1  . ondansetron (ZOFRAN) 4 MG tablet Take 1 tablet (4 mg total) by mouth every 8 (eight) hours as needed for nausea or vomiting. 20 tablet 3  . SUMAtriptan (IMITREX) 50 MG  tablet TAKE 1 TAB AT ONSET OF MIGRAINE. MAY REPEAT IN 2 HRS, IF NEEDED. MAX DOSE: 2 TABS/DAY. 12 tablet 11  . tiZANidine (ZANAFLEX) 4 MG tablet Take 1 tablet (4 mg total) by mouth every 6 (six) hours as needed for muscle spasms. 20 tablet 3   No current facility-administered medications for this visit.    PAST MEDICAL HISTORY: Past Medical History:  Diagnosis Date  . Chronic joint pain   . Endometriosis   . Headache   . Migraine     PAST SURGICAL HISTORY: Past Surgical History:  Procedure Laterality Date  . LAPAROSCOPIC ENDOMETRIOSIS FULGURATION      FAMILY HISTORY: Family History  Problem Relation Age of Onset  . Thyroid disease Mother   . Goiter Mother   . Thyroid disease Father   . Colon cancer Father   . Thyroid disease Sister   . Heart disease Paternal Grandmother   . Parkinson's disease Paternal Grandfather   . Breast cancer Paternal Aunt 74    SOCIAL HISTORY:  Social History   Socioeconomic History  . Marital status: Married    Spouse name: Not on file  . Number of children: 1  . Years of education: College  . Highest education level: Not on file  Occupational History  . Occupation: Clinical biochemist  Tobacco Use  . Smoking status: Never Smoker  . Smokeless tobacco: Never Used  Substance and Sexual Activity  . Alcohol use: No    Alcohol/week: 0.0 standard drinks  . Drug use: No  . Sexual activity: Not on  file  Other Topics Concern  . Not on file  Social History Narrative   Lives at home with husband and daughter.   Right-handed.   No caffeine use.   Social Determinants of Health   Financial Resource Strain:   . Difficulty of Paying Living Expenses: Not on file  Food Insecurity:   . Worried About Programme researcher, broadcasting/film/video in the Last Year: Not on file  . Ran Out of Food in the Last Year: Not on file  Transportation Needs:   . Lack of Transportation (Medical): Not on file  . Lack of Transportation (Non-Medical): Not on file  Physical Activity:   .  Days of Exercise per Week: Not on file  . Minutes of Exercise per Session: Not on file  Stress:   . Feeling of Stress : Not on file  Social Connections:   . Frequency of Communication with Friends and Family: Not on file  . Frequency of Social Gatherings with Friends and Family: Not on file  . Attends Religious Services: Not on file  . Active Member of Clubs or Organizations: Not on file  . Attends Banker Meetings: Not on file  . Marital Status: Not on file  Intimate Partner Violence:   . Fear of Current or Ex-Partner: Not on file  . Emotionally Abused: Not on file  . Physically Abused: Not on file  . Sexually Abused: Not on file     PHYSICAL EXAM   Vitals:   11/16/19 0732  BP: 98/60  Pulse: 70  Temp: (!) 97 F (36.1 C)  Weight: 159 lb 8 oz (72.3 kg)  Height: 5' 5.5" (1.664 m)    Not recorded      Body mass index is 26.14 kg/m.  PHYSICAL EXAMNIATION:  Gen: NAD, conversant, well nourised,  well groomed                     Cardiovascular: Regular rate rhythm, no peripheral edema, warm, nontender. Eyes: Conjunctivae clear without exudates or hemorrhage Neck: Supple, no carotid bruits. Pulmonary: Clear to auscultation bilaterally   NEUROLOGICAL EXAM:  MENTAL STATUS: Speech:    Speech is normal; fluent and spontaneous with normal comprehension.  Cognition:     Orientation to time, place and person     Normal recent and remote memory     Normal Attention span and concentration     Normal Language, naming, repeating,spontaneous speech     Fund of knowledge   CRANIAL NERVES: CN II: Visual fields are full to confrontation.  Pupils are round equal and briskly reactive to light. CN III, IV, VI: extraocular movement are normal. No ptosis. CN V: Facial sensation is intact  CN VII: Face is symmetric with normal eye closure and smile. CN VIII: Hearing is normal to rubbing fingers CN IX, X: Phonation is normal. CN XI: Head turning and shoulder shrug are  intact  MOTOR: There is no pronator drift of out-stretched arms. Muscle bulk and tone are normal. Muscle strength is normal.  REFLEXES: Reflexes are 2+ and symmetric at the biceps, triceps, knees, and ankles. Plantar responses are flexor.  SENSORY: Intact to light touch, pinprick, positional sensation and vibratory sensation are intact in fingers and toes.  COORDINATION: Rapid alternating movements and fine finger movements are intact. There is no dysmetria on finger-to-nose and heel-knee-shin.    GAIT/STANCE: Posture is normal. Gait is steady with normal steps, base, arm swing, and turning. Heel and toe walking are normal. Tandem gait  is normal.  Romberg is absent.   DIAGNOSTIC DATA (LABS, IMAGING, TESTING) - I reviewed patient records, labs, notes, testing and imaging myself where available.   ASSESSMENT AND PLAN  LAQUETA BONAVENTURA is a 44 y.o. female   Chronic migraine  Imitrex 50 mg as needed  She may combine it with Aleve,Zofran,  Return to clinic as needed.   Marcial Pacas, M.D. Ph.D.  [facility] [address] [place], [place] [address] Ph: [phone] Fax: [phone]  CC: Kelton Pillar, MD, Brien Few, MD

## 2020-05-17 ENCOUNTER — Other Ambulatory Visit: Payer: Self-pay | Admitting: Obstetrics and Gynecology

## 2020-05-17 DIAGNOSIS — Z1231 Encounter for screening mammogram for malignant neoplasm of breast: Secondary | ICD-10-CM

## 2020-05-20 DIAGNOSIS — D2261 Melanocytic nevi of right upper limb, including shoulder: Secondary | ICD-10-CM | POA: Diagnosis not present

## 2020-05-20 DIAGNOSIS — D2372 Other benign neoplasm of skin of left lower limb, including hip: Secondary | ICD-10-CM | POA: Diagnosis not present

## 2020-05-20 DIAGNOSIS — D1801 Hemangioma of skin and subcutaneous tissue: Secondary | ICD-10-CM | POA: Diagnosis not present

## 2020-05-20 DIAGNOSIS — D2361 Other benign neoplasm of skin of right upper limb, including shoulder: Secondary | ICD-10-CM | POA: Diagnosis not present

## 2020-06-02 ENCOUNTER — Other Ambulatory Visit: Payer: Self-pay

## 2020-06-02 ENCOUNTER — Ambulatory Visit
Admission: RE | Admit: 2020-06-02 | Discharge: 2020-06-02 | Disposition: A | Discharge status: Home / Self Care | Payer: BC Managed Care – PPO | Source: Ambulatory Visit | Attending: Obstetrics and Gynecology | Admitting: Obstetrics and Gynecology

## 2020-06-02 DIAGNOSIS — Z1231 Encounter for screening mammogram for malignant neoplasm of breast: Secondary | ICD-10-CM | POA: Diagnosis not present

## 2020-06-11 DIAGNOSIS — Z20822 Contact with and (suspected) exposure to covid-19: Secondary | ICD-10-CM | POA: Diagnosis not present

## 2020-08-10 DIAGNOSIS — I7300 Raynaud's syndrome without gangrene: Secondary | ICD-10-CM | POA: Diagnosis not present

## 2020-08-10 DIAGNOSIS — M25561 Pain in right knee: Secondary | ICD-10-CM | POA: Diagnosis not present

## 2020-08-10 DIAGNOSIS — M79643 Pain in unspecified hand: Secondary | ICD-10-CM | POA: Diagnosis not present

## 2020-08-10 DIAGNOSIS — M2550 Pain in unspecified joint: Secondary | ICD-10-CM | POA: Diagnosis not present

## 2020-08-10 DIAGNOSIS — M545 Low back pain: Secondary | ICD-10-CM | POA: Diagnosis not present

## 2020-08-10 DIAGNOSIS — M79641 Pain in right hand: Secondary | ICD-10-CM | POA: Diagnosis not present

## 2020-08-10 DIAGNOSIS — M79642 Pain in left hand: Secondary | ICD-10-CM | POA: Diagnosis not present

## 2020-08-10 DIAGNOSIS — M25562 Pain in left knee: Secondary | ICD-10-CM | POA: Diagnosis not present

## 2020-08-10 DIAGNOSIS — M549 Dorsalgia, unspecified: Secondary | ICD-10-CM | POA: Diagnosis not present

## 2020-09-23 DIAGNOSIS — B349 Viral infection, unspecified: Secondary | ICD-10-CM | POA: Diagnosis not present

## 2020-09-30 DIAGNOSIS — K219 Gastro-esophageal reflux disease without esophagitis: Secondary | ICD-10-CM | POA: Diagnosis not present

## 2020-09-30 DIAGNOSIS — Z131 Encounter for screening for diabetes mellitus: Secondary | ICD-10-CM | POA: Diagnosis not present

## 2020-09-30 DIAGNOSIS — E042 Nontoxic multinodular goiter: Secondary | ICD-10-CM | POA: Diagnosis not present

## 2020-09-30 DIAGNOSIS — Z13220 Encounter for screening for lipoid disorders: Secondary | ICD-10-CM | POA: Diagnosis not present

## 2020-09-30 DIAGNOSIS — Z0000 Encounter for general adult medical examination without abnormal findings: Secondary | ICD-10-CM | POA: Diagnosis not present

## 2020-11-24 DIAGNOSIS — R059 Cough, unspecified: Secondary | ICD-10-CM | POA: Diagnosis not present

## 2020-11-24 DIAGNOSIS — K219 Gastro-esophageal reflux disease without esophagitis: Secondary | ICD-10-CM | POA: Diagnosis not present

## 2020-11-24 DIAGNOSIS — Z7189 Other specified counseling: Secondary | ICD-10-CM | POA: Diagnosis not present

## 2020-12-13 ENCOUNTER — Other Ambulatory Visit: Payer: Self-pay | Admitting: Neurology

## 2021-01-03 DIAGNOSIS — R140 Abdominal distension (gaseous): Secondary | ICD-10-CM | POA: Diagnosis not present

## 2021-01-03 DIAGNOSIS — Z800 Family history of malignant neoplasm of digestive organs: Secondary | ICD-10-CM | POA: Diagnosis not present

## 2021-01-03 DIAGNOSIS — K5904 Chronic idiopathic constipation: Secondary | ICD-10-CM | POA: Diagnosis not present

## 2021-01-03 DIAGNOSIS — Z1211 Encounter for screening for malignant neoplasm of colon: Secondary | ICD-10-CM | POA: Diagnosis not present

## 2021-01-09 ENCOUNTER — Other Ambulatory Visit: Payer: Self-pay | Admitting: Neurology

## 2021-01-23 ENCOUNTER — Telehealth: Payer: Self-pay | Admitting: Neurology

## 2021-01-23 ENCOUNTER — Other Ambulatory Visit: Payer: Self-pay | Admitting: Neurology

## 2021-01-23 MED ORDER — SUMATRIPTAN SUCCINATE 50 MG PO TABS: ORAL_TABLET | ORAL | 0 refills | Status: AC

## 2021-01-23 NOTE — Telephone Encounter (Signed)
I spoke to the patient. She will speak to her PCP about taking over this medication since she is no longer having to follow up at our office. 30-day supply sent to pharmacy.

## 2021-01-23 NOTE — Telephone Encounter (Signed)
Pt called wanting a refill for SUMAtriptan (IMITREX) 50 MG tablet. Informed Pt needed to schedule a yearly f/u for medication refill. Pt stated, was told by Dr. Terrace Arabia I did not have to schedule a f/u, just call for my refills. Pt would like a call from the nurse.

## 2021-02-13 DIAGNOSIS — Z800 Family history of malignant neoplasm of digestive organs: Secondary | ICD-10-CM | POA: Diagnosis not present

## 2021-02-13 DIAGNOSIS — Z1211 Encounter for screening for malignant neoplasm of colon: Secondary | ICD-10-CM | POA: Diagnosis not present

## 2021-02-13 DIAGNOSIS — K621 Rectal polyp: Secondary | ICD-10-CM | POA: Diagnosis not present

## 2021-02-13 DIAGNOSIS — D128 Benign neoplasm of rectum: Secondary | ICD-10-CM | POA: Diagnosis not present

## 2021-02-17 ENCOUNTER — Other Ambulatory Visit: Payer: Self-pay | Admitting: Neurology

## 2021-03-22 DIAGNOSIS — R109 Unspecified abdominal pain: Secondary | ICD-10-CM | POA: Diagnosis not present

## 2021-04-19 ENCOUNTER — Telehealth: Payer: Self-pay | Admitting: Endocrinology

## 2021-04-19 DIAGNOSIS — M7918 Myalgia, other site: Secondary | ICD-10-CM | POA: Diagnosis not present

## 2021-04-19 DIAGNOSIS — E049 Nontoxic goiter, unspecified: Secondary | ICD-10-CM | POA: Diagnosis not present

## 2021-04-19 NOTE — Telephone Encounter (Signed)
Update appt?

## 2021-04-19 NOTE — Telephone Encounter (Signed)
Pt calling in stating that she is having and a lump, with swollen Neck. August 31st is when her app is. Pt is wanting to be seen sooner.

## 2021-05-03 ENCOUNTER — Ambulatory Visit (INDEPENDENT_AMBULATORY_CARE_PROVIDER_SITE_OTHER): Payer: BC Managed Care – PPO | Admitting: Endocrinology

## 2021-05-03 ENCOUNTER — Encounter: Payer: Self-pay | Admitting: Endocrinology

## 2021-05-03 ENCOUNTER — Other Ambulatory Visit: Payer: Self-pay

## 2021-05-03 VITALS — BP 120/82 | HR 60 | Ht 65.0 in | Wt 169.0 lb

## 2021-05-03 DIAGNOSIS — E042 Nontoxic multinodular goiter: Secondary | ICD-10-CM | POA: Diagnosis not present

## 2021-05-03 NOTE — Patient Instructions (Addendum)
Let's recheck the ultrasound.  you will receive a phone call, about a day and time for an appointment.   Blood tests are requested for you today.  We'll let you know about the results.

## 2021-05-03 NOTE — Progress Notes (Signed)
Subjective:    Patient ID: Shawna Chang, female    DOB: Sep 11, 1976, 45 y.o.   MRN: [id number]  HPI I last saw this pt is early 2019.  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 She pain at the ant/inf neck, and slightly swelling at the left ant neck.   Past Medical History:  Diagnosis Date   Chronic joint pain    Endometriosis    Headache    Migraine     Past Surgical History:  Procedure Laterality Date   LAPAROSCOPIC ENDOMETRIOSIS FULGURATION      Social History   Socioeconomic History   Marital status: Married    Spouse name: Not on file   Number of children: 1   Years of education: College   Highest education level: Not on file  Occupational History   Occupation: Customer service  Tobacco Use   Smoking status: Never   Smokeless tobacco: Never  Vaping Use   Vaping Use: Never used  Substance and Sexual Activity   Alcohol use: No    Alcohol/week: 0.0 standard drinks   Drug use: No   Sexual activity: Not on file  Other Topics Concern   Not on file  Social History Narrative   Lives at home with husband and daughter.   Right-handed.   No caffeine use.   Social Determinants of Health   Financial Resource Strain: Not on file  Food Insecurity: Not on file  Transportation Needs: Not on file  Physical Activity: Not on file  Stress: Not on file  Social Connections: Not on file  Intimate Partner Violence: Not on file    Current Outpatient Medications on File Prior to Visit  Medication Sig Dispense Refill   DULoxetine (CYMBALTA) 30 MG capsule Take 30 mg by mouth daily.     mometasone (ELOCON) 0.1 % ointment 1 application 2 times daily until resolved. 15 g 1   ondansetron (ZOFRAN) 4 MG tablet Take one tab every 8 hours as needed for nausea and vomiting. Future refills should be managed by PCP. 20 tablet 0    SUMAtriptan (IMITREX) 50 MG tablet TAKE 1 TAB AT ONSET OF MIGRAINE. MAY REPEAT IN 2 HRS, IF NEEDED. MAX DOSE: 2 TABS/DAY. Future refills should be managed by PCP. 12 tablet 0   tiZANidine (ZANAFLEX) 4 MG tablet Take 1 tablet (4 mg total) by mouth every 6 (six) hours as needed for muscle spasms. 20 tablet 3   No current facility-administered medications on file prior to visit.    Allergies  Allergen Reactions   Amoxicillin     GI issues   Codeine Other (See Comments)    Unsure - childhood allergy   Lidocaine Hives    Family History  Problem Relation Age of Onset   Thyroid disease Mother    Goiter Mother    Thyroid disease Father    Colon cancer Father    Thyroid disease Sister    Heart disease Paternal Grandmother    Parkinson's disease Paternal Grandfather    Breast cancer Paternal Aunt 50    BP 120/82   Pulse 60   Ht 5\' 5"  (1.651 m)   Wt 169 lb (76.7 kg)   LMP 01/13/2018   SpO2 99%   BMI 28.12 kg/m    Review of Systems She has fatigue, weight gain, and difficulty with concentration.  Objective:   Physical Exam VITAL SIGNS:  See vs page GENERAL: no distress NECK: thyroid is again noted to be slightly enlarged, with a irregular surface.    Lab Results  Component Value Date   TSH 0.87 05/03/2021      Assessment & Plan:  Neck swelling, new, uncertain etiology and prognosis   Patient Instructions  Let's recheck the ultrasound.  you will receive a phone call, about a day and time for an appointment.   Blood tests are requested for you today.  We'll let you know about the results.

## 2021-05-04 ENCOUNTER — Other Ambulatory Visit: Payer: Self-pay | Admitting: Obstetrics and Gynecology

## 2021-05-04 ENCOUNTER — Encounter: Payer: Self-pay | Admitting: Endocrinology

## 2021-05-04 DIAGNOSIS — Z1231 Encounter for screening mammogram for malignant neoplasm of breast: Secondary | ICD-10-CM

## 2021-05-04 LAB — TSH: TSH: 0.87 u[IU]/mL (ref 0.35–4.50)

## 2021-05-04 LAB — T4, FREE: Free T4: 0.88 ng/dL (ref 0.60–1.60)

## 2021-05-05 DIAGNOSIS — E063 Autoimmune thyroiditis: Secondary | ICD-10-CM | POA: Diagnosis not present

## 2021-05-05 DIAGNOSIS — B3782 Candidal enteritis: Secondary | ICD-10-CM | POA: Diagnosis not present

## 2021-05-05 DIAGNOSIS — M797 Fibromyalgia: Secondary | ICD-10-CM | POA: Diagnosis not present

## 2021-05-05 DIAGNOSIS — E039 Hypothyroidism, unspecified: Secondary | ICD-10-CM | POA: Diagnosis not present

## 2021-05-05 DIAGNOSIS — E782 Mixed hyperlipidemia: Secondary | ICD-10-CM | POA: Diagnosis not present

## 2021-05-05 DIAGNOSIS — E7212 Methylenetetrahydrofolate reductase deficiency: Secondary | ICD-10-CM | POA: Diagnosis not present

## 2021-05-05 DIAGNOSIS — B3789 Other sites of candidiasis: Secondary | ICD-10-CM | POA: Diagnosis not present

## 2021-05-16 DIAGNOSIS — Z6827 Body mass index (BMI) 27.0-27.9, adult: Secondary | ICD-10-CM | POA: Diagnosis not present

## 2021-05-16 DIAGNOSIS — Z01411 Encounter for gynecological examination (general) (routine) with abnormal findings: Secondary | ICD-10-CM | POA: Diagnosis not present

## 2021-05-16 DIAGNOSIS — Z124 Encounter for screening for malignant neoplasm of cervix: Secondary | ICD-10-CM | POA: Diagnosis not present

## 2021-05-16 DIAGNOSIS — Z113 Encounter for screening for infections with a predominantly sexual mode of transmission: Secondary | ICD-10-CM | POA: Diagnosis not present

## 2021-05-16 DIAGNOSIS — Z01419 Encounter for gynecological examination (general) (routine) without abnormal findings: Secondary | ICD-10-CM | POA: Diagnosis not present

## 2021-05-22 ENCOUNTER — Ambulatory Visit
Admission: RE | Admit: 2021-05-22 | Discharge: 2021-05-22 | Disposition: A | Discharge status: Home / Self Care | Payer: BC Managed Care – PPO | Source: Ambulatory Visit | Attending: Endocrinology | Admitting: Endocrinology

## 2021-05-22 ENCOUNTER — Other Ambulatory Visit: Payer: Self-pay

## 2021-05-22 DIAGNOSIS — E041 Nontoxic single thyroid nodule: Secondary | ICD-10-CM | POA: Diagnosis not present

## 2021-05-22 DIAGNOSIS — E042 Nontoxic multinodular goiter: Secondary | ICD-10-CM

## 2021-06-12 DIAGNOSIS — D2262 Melanocytic nevi of left upper limb, including shoulder: Secondary | ICD-10-CM | POA: Diagnosis not present

## 2021-06-12 DIAGNOSIS — D1801 Hemangioma of skin and subcutaneous tissue: Secondary | ICD-10-CM | POA: Diagnosis not present

## 2021-06-12 DIAGNOSIS — L245 Irritant contact dermatitis due to other chemical products: Secondary | ICD-10-CM | POA: Diagnosis not present

## 2021-06-12 DIAGNOSIS — L821 Other seborrheic keratosis: Secondary | ICD-10-CM | POA: Diagnosis not present

## 2021-06-26 ENCOUNTER — Ambulatory Visit
Admission: RE | Admit: 2021-06-26 | Discharge: 2021-06-26 | Disposition: A | Discharge status: Home / Self Care | Payer: BC Managed Care – PPO | Source: Ambulatory Visit | Attending: Obstetrics and Gynecology | Admitting: Obstetrics and Gynecology

## 2021-06-26 ENCOUNTER — Other Ambulatory Visit: Payer: Self-pay

## 2021-06-26 DIAGNOSIS — Z1231 Encounter for screening mammogram for malignant neoplasm of breast: Secondary | ICD-10-CM | POA: Diagnosis not present

## 2021-07-12 ENCOUNTER — Ambulatory Visit: Payer: BC Managed Care – PPO | Admitting: Endocrinology

## 2021-10-18 DIAGNOSIS — B948 Sequelae of other specified infectious and parasitic diseases: Secondary | ICD-10-CM | POA: Diagnosis not present

## 2021-10-18 DIAGNOSIS — R419 Unspecified symptoms and signs involving cognitive functions and awareness: Secondary | ICD-10-CM | POA: Diagnosis not present

## 2021-10-18 DIAGNOSIS — M797 Fibromyalgia: Secondary | ICD-10-CM | POA: Diagnosis not present

## 2021-10-18 DIAGNOSIS — Z131 Encounter for screening for diabetes mellitus: Secondary | ICD-10-CM | POA: Diagnosis not present

## 2021-10-18 DIAGNOSIS — G43909 Migraine, unspecified, not intractable, without status migrainosus: Secondary | ICD-10-CM | POA: Diagnosis not present

## 2021-10-18 DIAGNOSIS — Z13220 Encounter for screening for lipoid disorders: Secondary | ICD-10-CM | POA: Diagnosis not present

## 2021-10-18 DIAGNOSIS — E049 Nontoxic goiter, unspecified: Secondary | ICD-10-CM | POA: Diagnosis not present

## 2021-10-18 DIAGNOSIS — Z0000 Encounter for general adult medical examination without abnormal findings: Secondary | ICD-10-CM | POA: Diagnosis not present

## 2022-03-15 DIAGNOSIS — N644 Mastodynia: Secondary | ICD-10-CM | POA: Diagnosis not present

## 2022-05-18 ENCOUNTER — Other Ambulatory Visit: Payer: Self-pay | Admitting: Obstetrics and Gynecology

## 2022-05-18 DIAGNOSIS — Z1231 Encounter for screening mammogram for malignant neoplasm of breast: Secondary | ICD-10-CM

## 2022-05-23 DIAGNOSIS — Z124 Encounter for screening for malignant neoplasm of cervix: Secondary | ICD-10-CM | POA: Diagnosis not present

## 2022-05-23 DIAGNOSIS — Z01419 Encounter for gynecological examination (general) (routine) without abnormal findings: Secondary | ICD-10-CM | POA: Diagnosis not present

## 2022-05-23 DIAGNOSIS — Z6827 Body mass index (BMI) 27.0-27.9, adult: Secondary | ICD-10-CM | POA: Diagnosis not present

## 2022-06-27 ENCOUNTER — Ambulatory Visit: Payer: BC Managed Care – PPO

## 2022-07-12 ENCOUNTER — Ambulatory Visit
Admission: RE | Admit: 2022-07-12 | Discharge: 2022-07-12 | Disposition: A | Discharge status: Home / Self Care | Payer: BC Managed Care – PPO | Source: Ambulatory Visit | Attending: Obstetrics and Gynecology | Admitting: Obstetrics and Gynecology

## 2022-07-12 DIAGNOSIS — Z1231 Encounter for screening mammogram for malignant neoplasm of breast: Secondary | ICD-10-CM

## 2022-07-22 ENCOUNTER — Ambulatory Visit: Admit: 2022-07-22 | Discharge status: Against Medical Advice | Payer: BC Managed Care – PPO

## 2022-07-23 ENCOUNTER — Encounter: Payer: Self-pay | Admitting: Emergency Medicine

## 2022-07-23 ENCOUNTER — Ambulatory Visit
Admission: EM | Admit: 2022-07-23 | Discharge: 2022-07-23 | Disposition: A | Discharge status: Home / Self Care | Payer: BC Managed Care – PPO | Attending: Urgent Care | Admitting: Urgent Care

## 2022-07-23 DIAGNOSIS — W57XXXA Bitten or stung by nonvenomous insect and other nonvenomous arthropods, initial encounter: Secondary | ICD-10-CM | POA: Diagnosis not present

## 2022-07-23 DIAGNOSIS — S0096XA Insect bite (nonvenomous) of unspecified part of head, initial encounter: Secondary | ICD-10-CM | POA: Diagnosis not present

## 2022-07-23 MED ORDER — MUPIROCIN 2 % EX OINT: 1.0000 | TOPICAL_OINTMENT | Freq: Two times a day (BID) | CUTANEOUS | 0 refills | Status: AC

## 2022-07-23 MED ORDER — HYDROCORTISONE 1 % EX CREA: TOPICAL_CREAM | CUTANEOUS | 0 refills | Status: DC

## 2022-07-23 NOTE — Discharge Instructions (Addendum)
Clean the wound twice daily gently with soap and water.  Apply a pea-sized amount of hydrocortisone cream.  Rub it in completely.  Then apply a small amount of mupirocin ointment.  Follow up with your primary care provider if your symptoms are not improving.

## 2022-07-23 NOTE — ED Triage Notes (Signed)
Patient c/o Insect Bite on Lft side of face that happed 1 week ago.   Patient endorses increased pain. Patient endorses site is becoming larger and increased redness.   Patient denies drainage.   Patient has used OTC neosporin with no relief of symptoms.

## 2022-07-23 NOTE — ED Provider Notes (Signed)
Shawna Chang    CSN: [id number] Arrival date & time: 07/23/22  [id number]      History   Chief Complaint Chief Complaint  Patient presents with   Insect Bite    HPI Shawna Chang is a 46 y.o. female.   HPI Patient presents with 1 week history of visual irritation due to presumed insect bite which may have occurred when she was walking.  She reports she has some localized irritation and redness, with some swelling developing under her chin.  Past Medical History:  Diagnosis Date   Chronic joint pain    Endometriosis    Headache    Migraine     Patient Active Problem List   Diagnosis Date Noted   Chronic migraine 05/20/2018   Dermatitis venenata 12/09/2017   Headache    Facial swelling 11/19/2017   Herpes labialis 11/19/2017   Other allergic rhinitis 11/19/2017   Arthralgia 11/19/2017   Migraine syndrome 07/11/2017   Multinodular goiter 05/15/2015    Past Surgical History:  Procedure Laterality Date   LAPAROSCOPIC ENDOMETRIOSIS FULGURATION      OB History   No obstetric history on file.      Home Medications    Prior to Admission medications   Medication Sig Start Date End Date Taking? Authorizing Provider  DULoxetine (CYMBALTA) 30 MG capsule Take 30 mg by mouth daily. 11/02/19  Yes [provider]  hydrocortisone cream 1 % Apply to affected area 2 times daily. Apply after cleaning and before applying antibiotic ointment. 07/23/22  Yes Shalaya Swailes, Jeannett Senior, FNP  mupirocin ointment (BACTROBAN) 2 % Apply 1 Application topically 2 (two) times daily for 7 days. Apply after cleaning and applying hydrocortisone cream. 07/23/22 07/30/22 Yes Kimora Stankovic, Jeannett Senior, FNP  mometasone (ELOCON) 0.1 % ointment 1 application 2 times daily until resolved. 09/25/17   Kozlow, Alvira Philips, MD  ondansetron (ZOFRAN) 4 MG tablet Take one tab every 8 hours as needed for nausea and vomiting. Future refills should be managed by PCP. 12/13/20   Levert Feinstein, MD  SUMAtriptan  (IMITREX) 50 MG tablet TAKE 1 TAB AT ONSET OF MIGRAINE. MAY REPEAT IN 2 HRS, IF NEEDED. MAX DOSE: 2 TABS/DAY. Future refills should be managed by PCP. 01/23/21   Levert Feinstein, MD  tiZANidine (ZANAFLEX) 4 MG tablet Take 1 tablet (4 mg total) by mouth every 6 (six) hours as needed for muscle spasms. 05/20/18   Levert Feinstein, MD    Family History Family History  Problem Relation Age of Onset   Thyroid disease Mother    Goiter Mother    Thyroid disease Father    Colon cancer Father    Thyroid disease Sister    Heart disease Paternal Grandmother    Parkinson's disease Paternal Grandfather    Breast cancer Paternal Aunt 51    Social History Social History   Tobacco Use   Smoking status: Never   Smokeless tobacco: Never  Vaping Use   Vaping Use: Never used  Substance Use Topics   Alcohol use: No    Alcohol/week: 0.0 standard drinks of alcohol   Drug use: No     Allergies   Amoxicillin, Codeine, and Lidocaine   Review of Systems Review of Systems   Physical Exam Triage Vital Signs ED Triage Vitals  Enc Vitals Group     BP      Pulse      Resp      Temp      Temp src  SpO2      Weight      Height      Head Circumference      Peak Flow      Pain Score      Pain Loc      Pain Edu?      Excl. in GC?    No data found.  Updated Vital Signs BP 106/74 (BP Location: Left Arm)   Pulse 68   Temp 98.6 F (37 C) (Oral)   Resp 14   LMP 01/13/2018   SpO2 99%   Visual Acuity Right Eye Distance:   Left Eye Distance:   Bilateral Distance:    Right Eye Near:   Left Eye Near:    Bilateral Near:     Physical Exam Vitals reviewed.  Constitutional:      Appearance: Normal appearance.  HENT:     Head:   Neck:   Lymphadenopathy:     Cervical: Cervical adenopathy present.     Left cervical: Superficial cervical adenopathy present.  Skin:    General: Skin is warm and dry.  Neurological:     General: No focal deficit present.     Mental Status: She is alert and  oriented to person, place, and time.  Psychiatric:        Mood and Affect: Mood normal.        Behavior: Behavior normal.      UC Treatments / Results  Labs (all labs ordered are listed, but only abnormal results are displayed) Labs Reviewed - No data to display  EKG   Radiology No results found.  Procedures Procedures (including critical care time)  Medications Ordered in UC Medications - No data to display  Initial Impression / Assessment and Plan / UC Course  I have reviewed the triage vital signs and the nursing notes.  Pertinent labs & imaging results that were available during my care of the patient were reviewed by me and considered in my medical decision making (see chart for details).    Patient continues with nonhealing apparent insect bite after 1 week.  There is no evidence of infection and spite of the lymphadenopathy.  Feel this is allergic and will respond to local low potency corticosteroid cream.  Will cover risk of local infection with mupirocin.  Follow-up here or with primary care provider if no resolution after 1 week.  Final Clinical Impressions(s) / UC Diagnoses   Final diagnoses:  Insect bite of head, unspecified part, initial encounter     Discharge Instructions      Clean the wound twice daily gently with soap and water.  Apply a pea-sized amount of hydrocortisone cream.  Rub it in completely.  Then apply a small amount of mupirocin ointment.  Follow up with your primary care provider if your symptoms are not improving.       ED Prescriptions     Medication Sig Dispense Auth. Provider   hydrocortisone cream 1 % Apply to affected area 2 times daily. Apply after cleaning and before applying antibiotic ointment. 15 g Davena Julian, FNP   mupirocin ointment (BACTROBAN) 2 % Apply 1 Application topically 2 (two) times daily for 7 days. Apply after cleaning and applying hydrocortisone cream. 22 g Robie Mcniel, Jeannett Senior, FNP      PDMP not  reviewed this encounter.   Charma Igo, [place] 07/23/22 [phone]

## 2022-07-25 DIAGNOSIS — D2372 Other benign neoplasm of skin of left lower limb, including hip: Secondary | ICD-10-CM | POA: Diagnosis not present

## 2022-07-25 DIAGNOSIS — D2361 Other benign neoplasm of skin of right upper limb, including shoulder: Secondary | ICD-10-CM | POA: Diagnosis not present

## 2022-07-25 DIAGNOSIS — D1801 Hemangioma of skin and subcutaneous tissue: Secondary | ICD-10-CM | POA: Diagnosis not present

## 2022-07-25 DIAGNOSIS — L723 Sebaceous cyst: Secondary | ICD-10-CM | POA: Diagnosis not present

## 2022-10-23 DIAGNOSIS — E049 Nontoxic goiter, unspecified: Secondary | ICD-10-CM | POA: Diagnosis not present

## 2022-10-23 DIAGNOSIS — Z131 Encounter for screening for diabetes mellitus: Secondary | ICD-10-CM | POA: Diagnosis not present

## 2022-10-23 DIAGNOSIS — Z13220 Encounter for screening for lipoid disorders: Secondary | ICD-10-CM | POA: Diagnosis not present

## 2022-10-23 DIAGNOSIS — Z0000 Encounter for general adult medical examination without abnormal findings: Secondary | ICD-10-CM | POA: Diagnosis not present

## 2023-01-01 DIAGNOSIS — M25511 Pain in right shoulder: Secondary | ICD-10-CM | POA: Diagnosis not present

## 2023-02-05 DIAGNOSIS — J101 Influenza due to other identified influenza virus with other respiratory manifestations: Secondary | ICD-10-CM | POA: Diagnosis not present

## 2023-03-13 ENCOUNTER — Encounter: Payer: Self-pay | Admitting: "Endocrinology

## 2023-03-13 ENCOUNTER — Ambulatory Visit (INDEPENDENT_AMBULATORY_CARE_PROVIDER_SITE_OTHER): Payer: BC Managed Care – PPO | Admitting: "Endocrinology

## 2023-03-13 VITALS — BP 100/66 | HR 73 | Ht 65.0 in | Wt 171.2 lb

## 2023-03-13 DIAGNOSIS — E663 Overweight: Secondary | ICD-10-CM | POA: Diagnosis not present

## 2023-03-13 DIAGNOSIS — Z6828 Body mass index (BMI) 28.0-28.9, adult: Secondary | ICD-10-CM | POA: Diagnosis not present

## 2023-03-13 DIAGNOSIS — E042 Nontoxic multinodular goiter: Secondary | ICD-10-CM | POA: Diagnosis not present

## 2023-03-13 DIAGNOSIS — R232 Flushing: Secondary | ICD-10-CM | POA: Diagnosis not present

## 2023-03-13 NOTE — Patient Instructions (Addendum)
Calorie counting apps  Myplate CalorieKing MyFatSecret  Discuss paroxetine to help with hot flashes instead of Duloxetine

## 2023-03-13 NOTE — Progress Notes (Addendum)
Outpatient Endocrinology Note   Shawna Chang [id number]  Referring Provider: Maurice Small, MD Primary Care Provider: Maurice Small, MD Subjective  Chief Complaint  Patient presents with   Follow-up    Assessment & Plan  Shawna Chang was seen today for follow-up.  Diagnoses and all orders for this visit:  Multiple thyroid nodules -     TSH Rfx on Abnormal to Free T4; Future -     TSH Rfx on Abnormal to Free T4  Hot flashes  Overweight with body mass index (BMI) of 28 to 28.9 in adult   [employer]  is currently not on any thyroid medication. Patient currently clinically and biochemically euthyroid.  Educated on thyroid axis.  Repeat TSH, last TSH and FT4 in 2022 WNL.  History of multiple thyroid nodules Last FNA in 2016-unable to see results but per last note,  Last thyroid U/S in 7/20232 showed predominately subcentimeter bilateral thyroid nodules which do not meet criteria for FNA or imaging surveillance. No TIRADs scoring mentioned. Prior U/S was 2019 also reported "Mild thyromegaly with stable bilateral nodules. None currently meets criteria for biopsy or dedicated imaging follow-up." Given no current compressive symptoms, will continue to monitor clinically.   Patient concerned about hot flashes, has to change clothes due to getting drenched in sweat overnight.  Taking Cymbalta/duloxetine 30 mg daily for her fibromyalgia.  Discussed about paroxetine.  Patient will discuss with her PCP to see if she can be switched to paroxetine instead of duloxetine to control both hot flashes and fibromyalgia.  Patient also concerned about her weight gain of 37 pounds.  Currently Body mass index is 28.49 kg/m. Patient has tried phentermine in the past but had palpitations and had to stop it, does not remember the dose.  Patient is willing to pay out-of-pocket for Zepbound as it is not covered by her insurance company. No known history of  medullary thyroid cancer/MEN syndrome/pancreatitis/pancreatic cancer. Patient is intensive on her exercise regimen and eats appropriate low-calorie food without any sugary beverages.  Discussed with patient the need for calorie count, advised apps to do the same.  Will get baseline labs from PCP to assess A1c and lipids.  Patient interested in trial of phentermine, and if fails to try Zepbound out-of-pocket.   I spent more than 50% of today's visit counseling patient on symptoms, examination findings, lab findings, imaging results, treatment decisions and monitoring and prognosis. The patient understood the recommendations and agrees with the treatment plan. All questions regarding treatment plan were fully answered   Return in about 1 year (around 03/12/2024).   [name], MD    I have reviewed current medications, nurse's notes, allergies, vital signs, past medical and surgical history, family medical history, and social history for this encounter. Counseled patient on symptoms, examination findings, lab findings, imaging results, treatment decisions and monitoring and prognosis. The patient understood the recommendations and agrees with the treatment plan. All questions regarding treatment plan were fully answered.   History of Present Illness Shawna Chang is a 47 y.o. year old female who presents to our clinic with multiple thyroid nodules.   Per last note by Dr. Everardo All: "Pt has multinodular goiter (dx'ed 2016; bx then showed BENIGN FOLLICULAR NODULE (BETHESDA CATEGORY II); she has been euthyroid off rx; f/u US in 2019 showed no change, and no advised f/u)."  Symptoms suggestive of HYPOTHYROIDISM:  fatigue No weight gain Yes cold intolerance No constipation No  Symptoms suggestive of HYPERTHYROIDISM:  weight  loss No heat intolerance No hyperdefecation No palpitations No  Compressive symptoms:  dysphagia yes, some with large bites +- swallowed fast dysphonia  No positional dyspnea (especially with simultaneous arms elevation) No  Smokes No On biotin No Personal history of head/neck surgery/irradiation No  Patient concerned about hot flashes, has to change clothes due to getting drenched in sweat overnight.  Taking Cymbalta/duloxetine 30 mg daily for her fibromyalgia.  Patient also concerned about her weight gain of 37 pounds.  Currently Body mass index is 28.49 kg/m. Patient has tried phentermine in the past but had palpitations and had to stop it, does not remember the dose.  Patient is willing to pay out-of-pocket for Zepbound as it is not covered by her insurance company.  Patient is intensive on her exercise regimen and eats appropriate low-calorie food without any sugary beverages.  Physical Exam  BP 100/66 (BP Location: Right Arm, Patient Position: Sitting, Cuff Size: Normal)   Pulse 73   Ht 5\' 5"  (1.651 m)   Wt 171 lb 3.2 oz (77.7 kg)   LMP 01/13/2018   SpO2 99%   BMI 28.49 kg/m  Constitutional: well developed, well nourished Head: normocephalic, atraumatic, no exophthalmos Eyes: sclera anicteric, no redness Neck: Pemberton's sign WNL Lungs: normal respiratory effort Neurology: alert and oriented Skin: dry, no appreciable rashes Musculoskeletal: no appreciable defects Psychiatric: normal mood and affect  Allergies Allergies  Allergen Reactions   Amoxicillin     GI issues   Codeine Other (See Comments)    Unsure - childhood allergy   Lidocaine Hives    Current Medications Patient's Medications  New Prescriptions   No medications on file  Previous Medications   DULOXETINE (CYMBALTA) 30 MG CAPSULE    Take 30 mg by mouth daily.   HYDROCORTISONE CREAM 1 %    Apply to affected area 2 times daily. Apply after cleaning and before applying antibiotic ointment.   MOMETASONE (ELOCON) 0.1 % OINTMENT    1 application 2 times daily until resolved.   ONDANSETRON (ZOFRAN) 4 MG TABLET    Take one tab every 8 hours as needed for  nausea and vomiting. Future refills should be managed by PCP.   SUMATRIPTAN (IMITREX) 50 MG TABLET    TAKE 1 TAB AT ONSET OF MIGRAINE. MAY REPEAT IN 2 HRS, IF NEEDED. MAX DOSE: 2 TABS/DAY. Future refills should be managed by PCP.   TIZANIDINE (ZANAFLEX) 4 MG TABLET    Take 1 tablet (4 mg total) by mouth every 6 (six) hours as needed for muscle spasms.  Modified Medications   No medications on file  Discontinued Medications   No medications on file    Past Medical History Past Medical History:  Diagnosis Date   Chronic joint pain    Endometriosis    Headache    Migraine     Past Surgical History Past Surgical History:  Procedure Laterality Date   LAPAROSCOPIC ENDOMETRIOSIS FULGURATION      Family History family history includes Breast cancer (age of onset: 23) in her paternal aunt; Colon cancer in her father; Goiter in her mother; Heart disease in her paternal grandmother; Parkinson's disease in her paternal grandfather; Thyroid disease in her father, mother, and sister.  Social History Social History   Socioeconomic History   Marital status: Married    Spouse name: Not on file   Number of children: 1   Years of education: College   Highest education level: Not on file  Occupational History   Occupation: Customer  service  Tobacco Use   Smoking status: Never   Smokeless tobacco: Never  Vaping Use   Vaping Use: Never used  Substance and Sexual Activity   Alcohol use: No    Alcohol/week: 0.0 standard drinks of alcohol   Drug use: No   Sexual activity: Not on file  Other Topics Concern   Not on file  Social History Narrative   Lives at home with husband and daughter.   Right-handed.   No caffeine use.   Social Determinants of Health   Financial Resource Strain: Not on file  Food Insecurity: Not on file  Transportation Needs: Not on file  Physical Activity: Not on file  Stress: Not on file  Social Connections: Not on file  Intimate Partner Violence: Not on  file    Laboratory Investigations Lab Results  Component Value Date   TSH 0.87 05/03/2021   FREET4 0.88 05/03/2021        Imaging: US thyroid: Performed independent interpretation of the test    On: 05/23/2021 08:42  CLINICAL DATA:  Swelling and pain in anterior neck   EXAM: THYROID ULTRASOUND   TECHNIQUE: Ultrasound examination of the thyroid gland and adjacent soft tissues was performed.   COMPARISON:  12/09/2017   FINDINGS: Parenchymal Echotexture: Mildly heterogeneous   Isthmus: 0.2 cm   Right lobe: 4.7 x 1.8 x 1.8 cm   Left lobe: 4.9 x 1.6 x 1.7 cm   _________________________________________________________   Estimated total number of nodules >/= 1 cm: 0   Number of spongiform nodules >/=  2 cm not described below (TR1): 0   Number of mixed cystic and solid nodules >/= 1.5 cm not described below (TR2): 0   _________________________________________________________   Nodule 1: 1.0 x 0.9 x 0.7 cm solid isoechoic nodule in the superior right thyroid lobe does not meet criteria for imaging surveillance or FNA.   _________________________________________________________   Nodule 2: 0.7 x 0.7 x 0.7 cm isoechoic nodule in the superior right thyroid lobe does not meet criteria for imaging surveillance or FNA.   _________________________________________________________   Nodule 3: 0.8 x 0.7 x 0.7 cm mixed solid cystic nodule in the mid right thyroid lobe does not meet criteria for FNA or surveillance.   _________________________________________________________   Nodule 4: 0.5 x 0.6 x 0.5 cm isoechoic nodule in the inferior right thyroid lobe does not meet criteria for FNA or surveillance.   _________________________________________________________   Nodule 5: 0.7 x 0.6 x 0.4 cm hypoechoic solid nodule in the superior left thyroid lobe does not meet criteria for FNA or surveillance.   _________________________________________________________   Nodule  6: 0.9 x 0.6 x 0.8 cm solid hypoechoic nodule in the mid left thyroid lobe does not meet criteria for FNA or surveillance.   IMPRESSION: Predominately subcentimeter bilateral thyroid nodules which do not meet criteria for FNA or imaging surveillance.  _________  Ultrasound thyroid: Performed independent interpretation of the test   On: 12/09/2017 15:17    CLINICAL DATA:  Goiter . Previous FNA biopsy of mid left nodule 03/17/2015.   EXAM: THYROID ULTRASOUND   TECHNIQUE: Ultrasound examination of the thyroid gland and adjacent soft tissues was performed.   COMPARISON:  05/18/2016 and previous back to 02/28/2015   FINDINGS: Parenchymal Echotexture: Moderately heterogenous   Isthmus: 0.2 cm thickness, previously 0.4   Right lobe: 4.6 x 1.6 x 1.9 cm, previously 4.7 x 1.9 x 2.1   Left lobe: 5.6 x 1.9 x 1.8 cm, previously 5.1 x 1.8 x 1.9  _________________________________________________________   Estimated total number of nodules >/= 1 cm: 4   Number of spongiform nodules >/=  2 cm not described below (TR1): 0   Number of mixed cystic and solid nodules >/= 1.5 cm not described below (TR2): 0   _________________________________________________________   Nodule # 1:   Prior biopsy: No   Location: Right; Mid   Maximum size: 1.3 cm; Other 2 dimensions: 1.3 x 1 cm, previously, 1.2 x 0.9 x 0.8 cm   Composition: solid/almost completely solid (2)   Echogenicity: isoechoic (1)   Shape: not taller-than-wide (0)   Margins: ill-defined (0)   Echogenic foci: none (0)   ACR TI-RADS total points: 3.   ACR TI-RADS risk category:  TR3 (3 points).   Significant change in size (>/= 20% in two dimensions and minimal increase of 2 mm): No   Change in features: No   Change in ACR TI-RADS risk category: No   ACR TI-RADS recommendations:   Given size (<1.4 cm) and appearance, this nodule does NOT meet TI-RADS criteria for biopsy or dedicated follow-up.    _________________________________________________________   Nodule # 2:   Location: Right; Inferior   Maximum size: 1.2 cm; Other 2 dimensions: 1.2 x 0.8 cm   Composition: spongiform (0)   Echogenicity: hyperechoic (1)   Shape: not taller-than-wide (0)   Margins: ill-defined (0)   Echogenic foci: none (0)   ACR TI-RADS total points: 1.   ACR TI-RADS risk category: TR1 (0-1 points).   ACR TI-RADS recommendations:   This nodule does NOT meet TI-RADS criteria for biopsy or dedicated follow-up.   _________________________________________________________   Nodule # 3:   Prior biopsy: No   Location: Left; Inferior   Maximum size: 1.4 cm; Other 2 dimensions: 0.9 x 0.8 cm, previously, 1.4 x 1.1 x 0.8 cm   Composition: mixed cystic and solid (1)   Echogenicity: hypoechoic (2)   Shape: not taller-than-wide (0)   Margins: ill-defined (0)   Echogenic foci: large comet-tail artifacts (0)   ACR TI-RADS total points: 3.   ACR TI-RADS risk category:  TR3 (3 points).   Significant change in size (>/= 20% in two dimensions and minimal increase of 2 mm): No   Change in features: No   Change in ACR TI-RADS risk category: No   ACR TI-RADS recommendations:   Given size (<1.4 cm) and appearance, this nodule does NOT meet TI-RADS criteria for biopsy or dedicated follow-up   _________________________________________________________   1.1 x 0.9 x 0.4 cm spongiform nodule, mid left; this was previously biopsied.   0.7 cm hypoechoic nodule without calcifications, mid right   0.7 cm isoechoic nodule without calcifications, medial right   IMPRESSION: 1. Mild thyromegaly with stable bilateral nodules. None currently meets criteria for biopsy or dedicated imaging follow-up.   The above is in keeping with the ACR TI-RADS recommendations - J Am Coll Radiol 2017;14:587-595.  Parts of this note may have been dictated using voice recognition software. There may be  variances in spelling and vocabulary which are unintentional. Not all errors are proofread. Please notify the There[facility] Parkin if any discrepancies are noted or if the meaning of any statement is not clear.

## 2023-03-14 LAB — TSH RFX ON ABNORMAL TO FREE T4: TSH: 1 u[IU]/mL (ref 0.450–4.500)

## 2023-04-30 DIAGNOSIS — Z1331 Encounter for screening for depression: Secondary | ICD-10-CM | POA: Diagnosis not present

## 2023-04-30 DIAGNOSIS — M7501 Adhesive capsulitis of right shoulder: Secondary | ICD-10-CM | POA: Diagnosis not present

## 2023-04-30 DIAGNOSIS — M792 Neuralgia and neuritis, unspecified: Secondary | ICD-10-CM | POA: Diagnosis not present

## 2023-04-30 DIAGNOSIS — M797 Fibromyalgia: Secondary | ICD-10-CM | POA: Diagnosis not present

## 2023-04-30 DIAGNOSIS — E8889 Other specified metabolic disorders: Secondary | ICD-10-CM | POA: Diagnosis not present

## 2023-05-07 DIAGNOSIS — M7501 Adhesive capsulitis of right shoulder: Secondary | ICD-10-CM | POA: Diagnosis not present

## 2023-05-14 DIAGNOSIS — M797 Fibromyalgia: Secondary | ICD-10-CM | POA: Diagnosis not present

## 2023-05-14 DIAGNOSIS — M792 Neuralgia and neuritis, unspecified: Secondary | ICD-10-CM | POA: Diagnosis not present

## 2023-05-14 DIAGNOSIS — G43909 Migraine, unspecified, not intractable, without status migrainosus: Secondary | ICD-10-CM | POA: Diagnosis not present

## 2023-05-14 DIAGNOSIS — M357 Hypermobility syndrome: Secondary | ICD-10-CM | POA: Diagnosis not present

## 2023-05-23 DIAGNOSIS — N841 Polyp of cervix uteri: Secondary | ICD-10-CM | POA: Diagnosis not present

## 2023-05-23 DIAGNOSIS — Z124 Encounter for screening for malignant neoplasm of cervix: Secondary | ICD-10-CM | POA: Diagnosis not present

## 2023-05-23 DIAGNOSIS — Z01411 Encounter for gynecological examination (general) (routine) with abnormal findings: Secondary | ICD-10-CM | POA: Diagnosis not present

## 2023-05-23 DIAGNOSIS — N950 Postmenopausal bleeding: Secondary | ICD-10-CM | POA: Diagnosis not present

## 2023-05-24 ENCOUNTER — Other Ambulatory Visit: Payer: Self-pay | Admitting: Obstetrics and Gynecology

## 2023-05-24 DIAGNOSIS — E2839 Other primary ovarian failure: Secondary | ICD-10-CM

## 2023-05-30 ENCOUNTER — Other Ambulatory Visit: Payer: Self-pay | Admitting: Obstetrics and Gynecology

## 2023-05-30 DIAGNOSIS — Z1231 Encounter for screening mammogram for malignant neoplasm of breast: Secondary | ICD-10-CM

## 2023-05-31 ENCOUNTER — Ambulatory Visit
Admission: RE | Admit: 2023-05-31 | Discharge: 2023-05-31 | Disposition: A | Discharge status: Home / Self Care | Payer: BC Managed Care – PPO | Source: Ambulatory Visit | Attending: Obstetrics and Gynecology | Admitting: Obstetrics and Gynecology

## 2023-05-31 DIAGNOSIS — E2839 Other primary ovarian failure: Secondary | ICD-10-CM

## 2023-05-31 DIAGNOSIS — N958 Other specified menopausal and perimenopausal disorders: Secondary | ICD-10-CM | POA: Diagnosis not present

## 2023-05-31 DIAGNOSIS — E349 Endocrine disorder, unspecified: Secondary | ICD-10-CM | POA: Diagnosis not present

## 2023-05-31 DIAGNOSIS — M8588 Other specified disorders of bone density and structure, other site: Secondary | ICD-10-CM | POA: Diagnosis not present

## 2023-06-10 DIAGNOSIS — M7501 Adhesive capsulitis of right shoulder: Secondary | ICD-10-CM | POA: Diagnosis not present

## 2023-06-11 DIAGNOSIS — M7501 Adhesive capsulitis of right shoulder: Secondary | ICD-10-CM | POA: Diagnosis not present

## 2023-06-26 ENCOUNTER — Ambulatory Visit: Payer: BC Managed Care – PPO

## 2023-07-01 ENCOUNTER — Ambulatory Visit
Admission: RE | Admit: 2023-07-01 | Discharge: 2023-07-01 | Disposition: A | Discharge status: Home / Self Care | Payer: BC Managed Care – PPO | Source: Ambulatory Visit | Attending: Obstetrics and Gynecology | Admitting: Obstetrics and Gynecology

## 2023-07-01 DIAGNOSIS — Z1231 Encounter for screening mammogram for malignant neoplasm of breast: Secondary | ICD-10-CM

## 2023-07-12 ENCOUNTER — Other Ambulatory Visit: Payer: Self-pay

## 2023-07-12 ENCOUNTER — Ambulatory Visit
Admission: RE | Admit: 2023-07-12 | Discharge: 2023-07-12 | Disposition: A | Discharge status: Home / Self Care | Payer: BC Managed Care – PPO | Source: Ambulatory Visit

## 2023-07-12 VITALS — BP 102/69 | HR 72 | Temp 100.1°F | Resp 20

## 2023-07-12 DIAGNOSIS — W57XXXA Bitten or stung by nonvenomous insect and other nonvenomous arthropods, initial encounter: Secondary | ICD-10-CM

## 2023-07-12 DIAGNOSIS — M7989 Other specified soft tissue disorders: Secondary | ICD-10-CM

## 2023-07-12 DIAGNOSIS — S40862A Insect bite (nonvenomous) of left upper arm, initial encounter: Secondary | ICD-10-CM

## 2023-07-12 MED ORDER — DOXYCYCLINE HYCLATE 100 MG PO CAPS: 100.0000 mg | ORAL_CAPSULE | Freq: Two times a day (BID) | ORAL | 0 refills | Status: DC

## 2023-07-12 MED ORDER — PREDNISONE 20 MG PO TABS: 40.0000 mg | ORAL_TABLET | Freq: Every day | ORAL | 0 refills | Status: DC

## 2023-07-12 NOTE — ED Triage Notes (Signed)
Pt noticed multiple insect  to left arm yesterday and today left lower arm is red hot oozing and edematous

## 2023-07-12 NOTE — Discharge Instructions (Signed)
Today your evaluated for insect bites to the left arm and left ankle  Appear to be localized reaction but due to the amount of bites has caused significant swelling, redness and blistering and low-grade fever on initial evaluation do have concern for beginning of infection   take doxycycline every morning and every evening for 7 days  Starting tomorrow morning begin prednisone every morning with food for 5 days to help reduce swelling  Cleanse with soap and water during normal hygiene, pat do not rub, may cover with nonstick Band-Aid as needed for drainage or if at risk for becoming dirty otherwise may leave open to air  Do not pop blisters, if they naturally open on their own this is okay, I would advise wearing loose clothing as not to cause friction over the area  May continue ice over the affected area 10 to 15-minute intervals, may also use heat if you find it more comfortably  Elevate on pillows whenever sitting and lying to help reduce swelling and for general comfort  May take Tylenol every 6 hours as needed for pain  If you have any concerns regarding healing may follow-up with urgent care as needed

## 2023-07-12 NOTE — ED Provider Notes (Signed)
[name]    CSN: [id number] Arrival date & time: 07/12/23  1658      History   Chief Complaint Chief Complaint  Patient presents with   Insect Bite    Entered by patient    HPI Shawna Chang is a 47 y.o. female.   Patient presents for evaluation of erythema, blistering with drainage and swelling present to the left forearm and left ankle beginning 1 day ago after insect bites.  Insect unwitnessed.  Has attempted to clean and applied hydrocortisone which has shown no improvement.  Has not had this type of reaction in the past.  Denies presence of fever.  Past Medical History:  Diagnosis Date   Chronic joint pain    Endometriosis    Headache    Migraine     Patient Active Problem List   Diagnosis Date Noted   Chronic migraine 05/20/2018   Dermatitis venenata 12/09/2017   Headache    Facial swelling 11/19/2017   Herpes labialis 11/19/2017   Other allergic rhinitis 11/19/2017   Arthralgia 11/19/2017   Migraine syndrome 07/11/2017   Multinodular goiter 05/15/2015    Past Surgical History:  Procedure Laterality Date   LAPAROSCOPIC ENDOMETRIOSIS FULGURATION      OB History   No obstetric history on file.      Home Medications    Prior to Admission medications   Medication Sig Start Date End Date Taking? Authorizing Provider  doxycycline (VIBRAMYCIN) 100 MG capsule Take 1 capsule (100 mg total) by mouth 2 (two) times daily. 07/12/23  Yes Kahlea Cobert R, NP  hydrocortisone cream 1 % Apply to affected area 2 times daily. Apply after cleaning and before applying antibiotic ointment. 07/23/22  Yes Immordino, Jeannett Senior, FNP  ondansetron (ZOFRAN) 4 MG tablet Take one tab every 8 hours as needed for nausea and vomiting. Future refills should be managed by PCP. 12/13/20  Yes Levert Feinstein, MD  predniSONE (DELTASONE) 20 MG tablet Take 2 tablets (40 mg total) by mouth daily. 07/12/23  Yes Kimber Esterly R, NP  SUMAtriptan (IMITREX) 50 MG tablet TAKE 1 TAB  AT ONSET OF MIGRAINE. MAY REPEAT IN 2 HRS, IF NEEDED. MAX DOSE: 2 TABS/DAY. Future refills should be managed by PCP. 01/23/21  Yes Levert Feinstein, MD  tiZANidine (ZANAFLEX) 4 MG tablet Take 1 tablet (4 mg total) by mouth every 6 (six) hours as needed for muscle spasms. 05/20/18  Yes Levert Feinstein, MD  DULoxetine (CYMBALTA) 30 MG capsule Take 30 mg by mouth daily. 11/02/19   [provider]  mometasone (ELOCON) 0.1 % ointment 1 application 2 times daily until resolved. 09/25/17   Kozlow, Alvira Philips, MD    Family History Family History  Problem Relation Age of Onset   Thyroid disease Mother    Goiter Mother    Thyroid disease Father    Colon cancer Father    Thyroid disease Sister    Heart disease Paternal Grandmother    Parkinson's disease Paternal Grandfather    Breast cancer Paternal Aunt 8    Social History Social History   Tobacco Use   Smoking status: Never   Smokeless tobacco: Never  Vaping Use   Vaping status: Never Used  Substance Use Topics   Alcohol use: No    Alcohol/week: 0.0 standard drinks of alcohol   Drug use: No     Allergies   Amoxicillin, Codeine, and Lidocaine   Review of Systems Review of Systems   Physical Exam Triage Vital Signs ED  Triage Vitals  Encounter Vitals Group     BP 07/12/23 1709 102/69     Systolic BP Percentile --      Diastolic BP Percentile --      Pulse Rate 07/12/23 1709 72     Resp 07/12/23 1709 20     Temp 07/12/23 1709 100.1 F (37.8 C)     Temp src --      SpO2 07/12/23 1709 100 %     Weight --      Height --      Head Circumference --      Peak Flow --      Pain Score 07/12/23 1705 3     Pain Loc --      Pain Education --      Exclude from Growth Chart --    No data found.  Updated Vital Signs BP 102/69   Pulse 72   Temp 100.1 F (37.8 C)   Resp 20   LMP 01/13/2018   SpO2 100%   Visual Acuity Right Eye Distance:   Left Eye Distance:   Bilateral Distance:    Right Eye Near:   Left Eye Near:     Bilateral Near:     Physical Exam Constitutional:      Appearance: Normal appearance.  Eyes:     Extraocular Movements: Extraocular movements intact.  Skin:    Comments: Scattered yellow blistering over the left upper extremity, various sites draining serous fluid, surrounding erythema with moderate fluctuant swelling, skin tender to palpation and warm to touch, 2+ brachial and radial pulse  2 yellow blisters over the medial aspect of the left ankle with surrounding erythema, mild to moderate swelling  Neurological:     Mental Status: She is alert.      UC Treatments / Results  Labs (all labs ordered are listed, but only abnormal results are displayed) Labs Reviewed - No data to display  EKG   Radiology No results found.  Procedures Procedures (including critical care time)  Medications Ordered in UC Medications - No data to display  Initial Impression / Assessment and Plan / UC Course  I have reviewed the triage vital signs and the nursing notes.  Pertinent labs & imaging results that were available during my care of the patient were reviewed by me and considered in my medical decision making (see chart for details).  Left arm swelling, insect bite to left upper extremity, initial encounter  Appears to be most consistent with a localized reaction however do to there being at least 4-5 bite marks along the anterior of the left arm, has caused a significant amount of swelling, low-grade fever of 100.1, heat to skin and now drainage causes concern for infection prophylactically placed on doxycycline, recommended supportive care through Tylenol, ice, elevation, cleanse daily during normal hygiene with soap and water, covering as needed for drainage or risk for contamination, advised loose clothing to prevent friction , given strict precautions for any concerns regarding healing to follow-up for reevaluation Final Clinical Impressions(s) / UC Diagnoses   Final diagnoses:   Left arm swelling  Insect bite of left upper extremity, initial encounter     Discharge Instructions      Today your evaluated for insect bites to the left arm and left ankle  Appear to be localized reaction but due to the amount of bites has caused significant swelling, redness and blistering and low-grade fever on initial evaluation do have concern for beginning of infection  take doxycycline every morning and every evening for 7 days  Starting tomorrow morning begin prednisone every morning with food for 5 days to help reduce swelling  Cleanse with soap and water during normal hygiene, pat do not rub, may cover with nonstick Band-Aid as needed for drainage or if at risk for becoming dirty otherwise may leave open to air  Do not pop blisters, if they naturally open on their own this is okay, I would advise wearing loose clothing as not to cause friction over the area  May continue ice over the affected area 10 to 15-minute intervals, may also use heat if you find it more comfortably  Elevate on pillows whenever sitting and lying to help reduce swelling and for general comfort  May take Tylenol every 6 hours as needed for pain  If you have any concerns regarding healing may follow-up with urgent care as needed   ED Prescriptions     Medication Sig Dispense Auth. Provider   predniSONE (DELTASONE) 20 MG tablet Take 2 tablets (40 mg total) by mouth daily. 10 tablet Salli Quarry R, NP   doxycycline (VIBRAMYCIN) 100 MG capsule Take 1 capsule (100 mg total) by mouth 2 (two) times daily. 14 capsule Bransyn Adami, Elita Boone, NP      PDMP not reviewed this encounter.   Valinda Hoar, [place] 07/12/23 [phone]

## 2023-07-24 ENCOUNTER — Ambulatory Visit
Admission: RE | Admit: 2023-07-24 | Discharge: 2023-07-24 | Disposition: A | Discharge status: Home / Self Care | Payer: BC Managed Care – PPO | Source: Ambulatory Visit | Attending: Obstetrics and Gynecology | Admitting: Obstetrics and Gynecology

## 2023-07-24 DIAGNOSIS — Z1231 Encounter for screening mammogram for malignant neoplasm of breast: Secondary | ICD-10-CM | POA: Diagnosis not present

## 2023-08-12 ENCOUNTER — Encounter: Payer: Self-pay | Admitting: Endocrinology

## 2023-08-12 ENCOUNTER — Ambulatory Visit (INDEPENDENT_AMBULATORY_CARE_PROVIDER_SITE_OTHER): Payer: BC Managed Care – PPO | Admitting: Endocrinology

## 2023-08-12 VITALS — BP 120/80 | HR 75 | Ht 65.0 in | Wt 173.0 lb

## 2023-08-12 DIAGNOSIS — E042 Nontoxic multinodular goiter: Secondary | ICD-10-CM

## 2023-08-12 DIAGNOSIS — E663 Overweight: Secondary | ICD-10-CM | POA: Diagnosis not present

## 2023-08-12 DIAGNOSIS — Z6828 Body mass index (BMI) 28.0-28.9, adult: Secondary | ICD-10-CM

## 2023-08-12 MED ORDER — PHENTERMINE HCL 15 MG PO CAPS: 15.0000 mg | ORAL_CAPSULE | ORAL | 2 refills | Status: AC

## 2023-08-12 NOTE — Progress Notes (Signed)
Outpatient Endocrinology Note Shawna Tisheena Maguire, MD  08/12/23  Patient's Name: Shawna Chang    DOB: 1976-08-03    MRN: [id number]  REASON OF VISIT: Follow-up for thyroid nodules   PCP: Maurice Small, MD (Inactive)  HISTORY OF PRESENT ILLNESS:   Shawna Chang is a 47 y.o. old female with past medical history as listed below is presented for a follow up of thyroid nodules.  Pertinent Thyroid History:  Patient was previously seen by Dr. Everardo All and was also seen by Dr. Roosevelt Locks in May 2024.  Patient has multinodular goiter, initially diagnosed in 2016, status post FNA of mid left lobe dominant complex nodule with cytology benign/follicular nodule in May 2016 ( per previous notes).  Patient has bilateral mostly small thyroid nodules being monitored with serial ultrasound. -Ultrasound thyroid in January 2019 is stable bilateral thyroid nodules. -Ultrasound thyroid in July 2022, reviewed images mostly subcentimeter bilateral thyroid nodules, right dominant thyroid nodule measuring 1.0 cm other bilateral thyroid nodules are around 0.8 to 0.9 cm.  Bilateral heterogeneous thyroid parenchyma.  Bilateral thyroid nodules are mostly heterogeneous and complex.  -Patient is euthyroid and not on thyroid medication.  # Overweight -Patient has complaints of gradual weight gain.  BMI 28.88.  Patient reports she had tried phentermine in the remote past however had palpitation and had to be stopped.  She does not recall the dose of phentermine.  She has tried diet plan mostly eating greens and healthy diet.  She also tried intermittent fasting for several years.  She also does regular exercise.  She is interested in weight loss medication.  No personal history of pancreatitis.  No family history of medullary thyroid cancer/MEN 2 syndrome.  Outside labs reviewed and her hemoglobin A1c was 5.2% in June 2024.  Interval history 08/12/23 Patient was last seen in May 2024.  She presents today for  the follow-up of multiple thyroid nodules, she has complaints of increasing size of left thyroid, occasional discomfort and dysphagia on swallowing especially solid food and rare choking about couple of times a month.  She denies palpitation or heat intolerance.  Overall fair energy.  No change in bowel habit.  No dry skin.  No cold or heat intolerance.   Latest Reference Range & Units 03/13/23 09:21  TSH 0.450 - 4.500 uIU/mL 1.000    REVIEW OF SYSTEMS:  As per history of present illness.   PAST MEDICAL HISTORY: Past Medical History:  Diagnosis Date   Chronic joint pain    Endometriosis    Headache    Migraine     PAST SURGICAL HISTORY: Past Surgical History:  Procedure Laterality Date   LAPAROSCOPIC ENDOMETRIOSIS FULGURATION      ALLERGIES: Allergies  Allergen Reactions   Amoxicillin     GI issues   Codeine Other (See Comments)    Unsure - childhood allergy   Lidocaine Hives    FAMILY HISTORY:  Family History  Problem Relation Age of Onset   Thyroid disease Mother    Goiter Mother    Thyroid disease Father    Colon cancer Father    Thyroid disease Sister    Heart disease Paternal Grandmother    Parkinson's disease Paternal Grandfather    Breast cancer Paternal Aunt 65    SOCIAL HISTORY: Social History   Socioeconomic History   Marital status: Married    Spouse name: Not on file   Number of children: 1   Years of education: College   Highest education level: Not on  file  Occupational History   Occupation: Customer service  Tobacco Use   Smoking status: Never   Smokeless tobacco: Never  Vaping Use   Vaping status: Never Used  Substance and Sexual Activity   Alcohol use: No    Alcohol/week: 0.0 standard drinks of alcohol   Drug use: No   Sexual activity: Not on file  Other Topics Concern   Not on file  Social History Narrative   Lives at home with husband and daughter.   Right-handed.   No caffeine use.   Social Determinants of Health    Financial Resource Strain: Not on file  Food Insecurity: Not on file  Transportation Needs: Not on file  Physical Activity: Not on file  Stress: Not on file  Social Connections: Not on file    MEDICATIONS:  Current Outpatient Medications  Medication Sig Dispense Refill   ondansetron (ZOFRAN) 4 MG tablet Take one tab every 8 hours as needed for nausea and vomiting. Future refills should be managed by PCP. 20 tablet 0   phentermine 15 MG capsule Take 1 capsule (15 mg total) by mouth every morning. 30 capsule 2   SUMAtriptan (IMITREX) 50 MG tablet TAKE 1 TAB AT ONSET OF MIGRAINE. MAY REPEAT IN 2 HRS, IF NEEDED. MAX DOSE: 2 TABS/DAY. Future refills should be managed by PCP. 12 tablet 0   tiZANidine (ZANAFLEX) 4 MG tablet Take 1 tablet (4 mg total) by mouth every 6 (six) hours as needed for muscle spasms. 20 tablet 3   No current facility-administered medications for this visit.    PHYSICAL EXAM: Vitals:   08/12/23 1124  BP: 120/80  Pulse: 75  SpO2: 98%  Weight: 173 lb (78.5 kg)  Height: 5\' 5"  (1.651 m)   Body mass index is 28.79 kg/m.    General: Well developed, well nourished female in no apparent distress.  HEENT: AT/[place], no external lesions. Hearing intact to the spoken word Eyes: EOMI. No stare, proptosis. Conjunctiva clear and no icterus. Neck: Trachea midline, neck supple without appreciable thyromegaly or lymphadenopathy and left palpable thyroid nodule + Lungs: Clear to auscultation, no wheeze. Respirations not labored Heart: S1S2, Regular in rate and rhythm.  Abdomen: Soft, non tender Neurologic: Alert, oriented, normal speech, deep tendon biceps reflexes normal,  no gross focal neurological deficit Extremities: No pedal pitting edema, no tremors of outstretched hands Skin: Warm, color good.  Psychiatric: Does not appear depressed or anxious  PERTINENT HISTORIC LABORATORY AND IMAGING STUDIES:  All pertinent laboratory results were reviewed. Please see HPI also for  further details.   TSH  Date Value Ref Range Status  03/13/2023 1.000 0.450 - 4.500 uIU/mL Final  05/03/2021 0.87 0.35 - 4.50 uIU/mL Final  05/17/2016 1.220 0.450 - 4.500 uIU/mL Final     ASSESSMENT / PLAN  1. Multiple thyroid nodules   2. Overweight with body mass index (BMI) of 28 to 28.9 in adult    -Patient was diagnosed with multiple thyroid nodules in 2016 status post FNA of left dominant complex thyroid nodule in May 2016 with benign cytology.  Patient had repeat ultrasound thyroid in January 2019 and July 2022, bilateral mostly subcentimeter heterogeneous/complex thyroid nodule measuring about 0.8 to 1.0 cm, with no worrisome features.  No indication of FNA/biopsy of the thyroid nodules.  She is clinically and biochemically euthyroid, not on thyroid medication.  She had normal thyroid function test in May 2024. -Patient has complaints of increased size of left thyroid and increased symptoms of occasional dysphagia and  neck discomfort.  Plan: -Will check ultrasound thyroid to monitor thyroid nodules.  # Overweight, BMI 28.8 -Discussed in detail about diet plan and continue exercise at least moderate intensity of exercise 30 minutes for 5 days a week. -She has no medical coverage for weight loss medication, weight loss medication will not be cost effective for her. -Will try phentermine 15 mg daily.  Discussed potential side effects and provided return instruction.  Discussed that especially if she gets any palpitation asked to call our clinic. -Patient has headache, currently taking sumatriptan occasionally, asked to discuss with her primary care provider if she can be on Topamax for the management of headache.  Phentermine and Topamax together can be effective for weight loss as well.  Tricia was seen today for follow-up.  Diagnoses and all orders for this visit:  Multiple thyroid nodules -     US THYROID; Future  Overweight with body mass index (BMI) of 28 to 28.9 in  adult  Other orders -     phentermine 15 MG capsule; Take 1 capsule (15 mg total) by mouth every morning.    DISPOSITION Follow up in clinic in 2 months suggested.  All questions answered and patient verbalized understanding of the plan.  Shawna Rosielee Corporan, MD [facility] Endocrinology [facility] [address] [place], [address] [place], [place] [address] Phone # [phone]  At least part of this note was generated using voice recognition software. Inadvertent word errors may have occurred, which were not recognized during the proofreading process.

## 2023-08-12 NOTE — Patient Instructions (Addendum)
US thyroid plan.  Phentermine 15 mg daily.  Phentermine Capsules or Tablets What is this medication? PHENTERMINE (FEN ter meen) promotes weight loss. It works by decreasing appetite. It is often used for a short period of time. Changes to diet and exercise are often combined with this medication. This medicine may be used for other purposes; ask your health care provider or pharmacist if you have questions. COMMON BRAND NAME(S): Adipex-P, Atti-Plex P, Atti-Plex P Spansule, Fastin, Lomaira, Pro-Fast, Pro-Fast HS, Pro-Fast SA, Tara-8 What should I tell my care team before I take this medication? They need to know if you have any of these conditions: Agitation or nervousness Diabetes Glaucoma Heart disease High blood pressure History of substance use disorder History of stroke Kidney disease Lung disease called Primary Pulmonary Hypertension (PPH) Taken an MAOI, such as Carbex, Eldepryl, Marplan, Nardil, or Parnate in last 14 days Taking stimulant medications for attention disorders, weight loss, or to stay awake Thyroid disease An unusual or allergic reaction to phentermine, other medications, foods, dyes, or preservatives Pregnant or trying to get pregnant Breastfeeding How should I use this medication? Take this medication by mouth with a glass of water. Follow the directions on the prescription label. Take your medication at regular intervals. Do not take it more often than directed. Do not stop taking except on your care team's advice. Talk to your care team about the use of this medication in children. While this medication may be prescribed for children 17 years or older for selected conditions, precautions do apply. Overdosage: If you think you have taken too much of this medicine contact a poison control center or emergency room at once. NOTE: This medicine is only for you. Do not share this medicine with others. What if I miss a dose? If you miss a dose, take it as soon as you  can. If it is almost time for your next dose, take only that dose. Do not take double or extra doses. What may interact with this medication? Do not take this medication with any of the following: MAOIs, such as Carbex, Eldepryl, Marplan, Nardil, and Parnate This medication may also interact with the following: Alcohol Certain medications for depression, anxiety, or other mental health conditions Certain medications for blood pressure Linezolid Medications for colds or breathing difficulties, such as pseudoephedrine or phenylephrine Medications for diabetes Sibutramine Stimulant medications for ADHD, weight loss, or staying awake This list may not describe all possible interactions. Give your health care provider a list of all the medicines, herbs, non-prescription drugs, or dietary supplements you use. Also tell them if you smoke, drink alcohol, or use illegal drugs. Some items may interact with your medicine. What should I watch for while using this medication? Visit your care team for regular checks on your progress. Do not stop taking except on your care team's advice. You may develop a severe reaction. Your care team will tell you how much medication to take. Do not take this medication close to bedtime. It may prevent you from sleeping. This medication may affect your coordination, reaction time, or judgment. Do not drive or operate machinery until you know how this medication affects you. Sit up or stand slowly to reduce the risk of dizzy or fainting spells. Drinking alcohol with this medication can increase the risk of these side effects. This medication may affect blood sugar levels. Ask your care team if changes in diet or medications are needed if you have diabetes. Inform your care team if you wish to  become pregnant or think you might be pregnant. Losing weight while pregnant is not advised and may cause harm to the unborn child. Talk to your care team for more information. What  side effects may I notice from receiving this medication? Side effects that you should report to your care team as soon as possible: Allergic reactions--skin rash, itching, hives, swelling of the face, lips, tongue, or throat Heart valve disease--shortness of breath, chest pain, unusual weakness or fatigue, dizziness, feeling faint or lightheaded, fever, sudden weight gain, fast or irregular heartbeat Pulmonary hypertension--shortness of breath, chest pain, fast or irregular heartbeat, feeling faint or lightheaded, fatigue, swelling of the ankles or feet Side effects that usually do not require medical attention (report to your care team if they continue or are bothersome): Change in taste Diarrhea Dizziness Dry mouth Restlessness Trouble sleeping This list may not describe all possible side effects. Call your doctor for medical advice about side effects. You may report side effects to FDA at 1-800-FDA-1088. Where should I keep my medication? Keep out of the reach of children. This medication can be abused. Keep your medication in a safe place to protect it from theft. Do not share this medication with anyone. Selling or giving away this medication is dangerous and against the law. This medication may cause harm and death if it is taken by other adults, children, or pets. Return medication that has not been used to an official disposal site. Contact the DEA at 515-720-3690 or your city/county government to find a site. If you cannot return the medication, mix any unused medication with a substance like cat litter or coffee grounds. Then throw the medication away in a sealed container like a sealed bag or coffee can with a lid. Do not use the medication after the expiration date. Store at room temperature between 20 and 25 degrees C (68 and 77 degrees F). Keep container tightly closed. NOTE: This sheet is a summary. It may not cover all possible information. If you have questions about this  medicine, talk to your doctor, pharmacist, or health care provider.  2024 Elsevier/Gold Standard (2022-05-09 00:00:00)

## 2023-08-19 ENCOUNTER — Ambulatory Visit
Admission: RE | Admit: 2023-08-19 | Discharge: 2023-08-19 | Disposition: A | Discharge status: Home / Self Care | Payer: BC Managed Care – PPO | Source: Ambulatory Visit | Attending: Endocrinology | Admitting: Endocrinology

## 2023-08-19 DIAGNOSIS — E042 Nontoxic multinodular goiter: Secondary | ICD-10-CM

## 2023-08-19 DIAGNOSIS — E041 Nontoxic single thyroid nodule: Secondary | ICD-10-CM | POA: Diagnosis not present

## 2023-08-20 DIAGNOSIS — M7501 Adhesive capsulitis of right shoulder: Secondary | ICD-10-CM | POA: Diagnosis not present

## 2023-09-09 DIAGNOSIS — J069 Acute upper respiratory infection, unspecified: Secondary | ICD-10-CM | POA: Diagnosis not present

## 2023-09-18 DIAGNOSIS — N950 Postmenopausal bleeding: Secondary | ICD-10-CM | POA: Diagnosis not present

## 2023-09-19 ENCOUNTER — Other Ambulatory Visit: Payer: Self-pay | Admitting: Obstetrics and Gynecology

## 2023-09-19 DIAGNOSIS — N644 Mastodynia: Secondary | ICD-10-CM

## 2023-09-20 ENCOUNTER — Encounter: Payer: Self-pay | Admitting: Obstetrics and Gynecology

## 2023-09-28 DIAGNOSIS — M25511 Pain in right shoulder: Secondary | ICD-10-CM | POA: Diagnosis not present

## 2023-10-03 ENCOUNTER — Ambulatory Visit
Admission: RE | Admit: 2023-10-03 | Discharge: 2023-10-03 | Disposition: A | Discharge status: Home / Self Care | Payer: BC Managed Care – PPO | Source: Ambulatory Visit | Attending: Obstetrics and Gynecology | Admitting: Obstetrics and Gynecology

## 2023-10-03 DIAGNOSIS — N644 Mastodynia: Secondary | ICD-10-CM | POA: Diagnosis not present

## 2023-10-09 DIAGNOSIS — M7501 Adhesive capsulitis of right shoulder: Secondary | ICD-10-CM | POA: Diagnosis not present

## 2023-10-16 ENCOUNTER — Ambulatory Visit: Payer: BC Managed Care – PPO | Admitting: Endocrinology

## 2023-10-30 DIAGNOSIS — Z13220 Encounter for screening for lipoid disorders: Secondary | ICD-10-CM | POA: Diagnosis not present

## 2023-10-30 DIAGNOSIS — Z0000 Encounter for general adult medical examination without abnormal findings: Secondary | ICD-10-CM | POA: Diagnosis not present

## 2024-02-05 DIAGNOSIS — L821 Other seborrheic keratosis: Secondary | ICD-10-CM | POA: Diagnosis not present

## 2024-02-05 DIAGNOSIS — D2262 Melanocytic nevi of left upper limb, including shoulder: Secondary | ICD-10-CM | POA: Diagnosis not present

## 2024-02-05 DIAGNOSIS — D2361 Other benign neoplasm of skin of right upper limb, including shoulder: Secondary | ICD-10-CM | POA: Diagnosis not present

## 2024-02-05 DIAGNOSIS — D2261 Melanocytic nevi of right upper limb, including shoulder: Secondary | ICD-10-CM | POA: Diagnosis not present

## 2024-03-05 ENCOUNTER — Ambulatory Visit: Payer: BC Managed Care – PPO | Admitting: "Endocrinology

## 2024-06-29 ENCOUNTER — Other Ambulatory Visit: Payer: Self-pay | Admitting: Obstetrics and Gynecology

## 2024-06-29 DIAGNOSIS — Z1231 Encounter for screening mammogram for malignant neoplasm of breast: Secondary | ICD-10-CM

## 2024-07-27 ENCOUNTER — Ambulatory Visit
Admission: RE | Admit: 2024-07-27 | Discharge: 2024-07-27 | Disposition: A | Discharge status: Home / Self Care | Source: Ambulatory Visit | Attending: Obstetrics and Gynecology | Admitting: Obstetrics and Gynecology

## 2024-07-27 DIAGNOSIS — Z1231 Encounter for screening mammogram for malignant neoplasm of breast: Secondary | ICD-10-CM

## 2024-08-04 DIAGNOSIS — L308 Other specified dermatitis: Secondary | ICD-10-CM | POA: Diagnosis not present

## 2024-08-04 DIAGNOSIS — L299 Pruritus, unspecified: Secondary | ICD-10-CM | POA: Diagnosis not present

## 2024-08-12 DIAGNOSIS — Z1331 Encounter for screening for depression: Secondary | ICD-10-CM | POA: Diagnosis not present

## 2024-08-12 DIAGNOSIS — Z01411 Encounter for gynecological examination (general) (routine) with abnormal findings: Secondary | ICD-10-CM | POA: Diagnosis not present

## 2024-08-12 DIAGNOSIS — F419 Anxiety disorder, unspecified: Secondary | ICD-10-CM | POA: Diagnosis not present

## 2024-09-11 DIAGNOSIS — F419 Anxiety disorder, unspecified: Secondary | ICD-10-CM | POA: Diagnosis not present

## 2024-11-02 DIAGNOSIS — M797 Fibromyalgia: Secondary | ICD-10-CM | POA: Diagnosis not present

## 2024-11-02 DIAGNOSIS — Z13220 Encounter for screening for lipoid disorders: Secondary | ICD-10-CM | POA: Diagnosis not present

## 2024-11-02 DIAGNOSIS — G43909 Migraine, unspecified, not intractable, without status migrainosus: Secondary | ICD-10-CM | POA: Diagnosis not present

## 2024-11-02 DIAGNOSIS — E049 Nontoxic goiter, unspecified: Secondary | ICD-10-CM | POA: Diagnosis not present

## 2024-11-02 DIAGNOSIS — K219 Gastro-esophageal reflux disease without esophagitis: Secondary | ICD-10-CM | POA: Diagnosis not present

## 2024-11-02 DIAGNOSIS — N951 Menopausal and female climacteric states: Secondary | ICD-10-CM | POA: Diagnosis not present

## 2024-11-02 DIAGNOSIS — Z0000 Encounter for general adult medical examination without abnormal findings: Secondary | ICD-10-CM | POA: Diagnosis not present
